# Patient Record
Sex: Male | Born: 1995 | Hispanic: Yes | Marital: Married | Smoking: Current every day smoker
Health system: Southern US, Community
[De-identification: ages and names within clinical notes are randomized; demographics above are authoritative.]

---

## 2016-10-09 ENCOUNTER — Encounter (HOSPITAL_COMMUNITY): Payer: Self-pay | Admitting: Emergency Medicine

## 2016-10-09 ENCOUNTER — Observation Stay (HOSPITAL_COMMUNITY)
Admission: EM | Admit: 2016-10-09 | Discharge: 2016-10-10 | Disposition: A | Discharge status: Home / Self Care | Payer: Self-pay | Attending: Surgery | Admitting: Surgery

## 2016-10-09 ENCOUNTER — Emergency Department (HOSPITAL_COMMUNITY): Payer: Self-pay | Admitting: Certified Registered"

## 2016-10-09 ENCOUNTER — Encounter (HOSPITAL_COMMUNITY)
Admission: EM | Disposition: A | Discharge status: Home / Self Care | Payer: Self-pay | Source: Home / Self Care | Attending: Emergency Medicine

## 2016-10-09 ENCOUNTER — Emergency Department (HOSPITAL_COMMUNITY): Payer: Self-pay

## 2016-10-09 DIAGNOSIS — Z720 Tobacco use: Secondary | ICD-10-CM

## 2016-10-09 DIAGNOSIS — K3580 Unspecified acute appendicitis: Principal | ICD-10-CM | POA: Insufficient documentation

## 2016-10-09 DIAGNOSIS — F17210 Nicotine dependence, cigarettes, uncomplicated: Secondary | ICD-10-CM | POA: Insufficient documentation

## 2016-10-09 DIAGNOSIS — K353 Acute appendicitis with localized peritonitis, without perforation or gangrene: Secondary | ICD-10-CM

## 2016-10-09 HISTORY — PX: LAPAROSCOPIC APPENDECTOMY: SHX408

## 2016-10-09 LAB — LIPASE, BLOOD: Lipase: 24 U/L (ref 11–51)

## 2016-10-09 LAB — COMPREHENSIVE METABOLIC PANEL
ALBUMIN: 5.2 g/dL — AB (ref 3.5–5.0)
ALT: 45 U/L (ref 17–63)
ANION GAP: 9 (ref 5–15)
AST: 27 U/L (ref 15–41)
Alkaline Phosphatase: 55 U/L (ref 38–126)
BILIRUBIN TOTAL: 0.5 mg/dL (ref 0.3–1.2)
BUN: 20 mg/dL (ref 6–20)
CO2: 28 mmol/L (ref 22–32)
Calcium: 10 mg/dL (ref 8.9–10.3)
Chloride: 101 mmol/L (ref 101–111)
Creatinine, Ser: 1 mg/dL (ref 0.61–1.24)
GFR calc Af Amer: >60 mL/min (ref >60)
GFR calc non Af Amer: >60 mL/min (ref >60)
GLUCOSE: 132 mg/dL — AB (ref 65–99)
POTASSIUM: 4.2 mmol/L (ref 3.5–5.1)
SODIUM: 138 mmol/L (ref 135–145)
TOTAL PROTEIN: 8.8 g/dL — AB (ref 6.5–8.1)

## 2016-10-09 LAB — URINALYSIS, ROUTINE W REFLEX MICROSCOPIC
BILIRUBIN URINE: NEGATIVE
Glucose, UA: NEGATIVE mg/dL
Hgb urine dipstick: NEGATIVE
KETONES UR: NEGATIVE mg/dL
LEUKOCYTES UA: NEGATIVE
NITRITE: NEGATIVE
PROTEIN: NEGATIVE mg/dL
Specific Gravity, Urine: 1.021 (ref 1.005–1.030)
pH: 7 (ref 5.0–8.0)

## 2016-10-09 LAB — CBC
HEMATOCRIT: 42.4 % (ref 39.0–52.0)
HEMOGLOBIN: 15 g/dL (ref 13.0–17.0)
MCH: 30.4 pg (ref 26.0–34.0)
MCHC: 35.4 g/dL (ref 30.0–36.0)
MCV: 85.8 fL (ref 78.0–100.0)
Platelets: 240 10*3/uL (ref 150–400)
RBC: 4.94 MIL/uL (ref 4.22–5.81)
RDW: 12.6 % (ref 11.5–15.5)
WBC: 17.2 10*3/uL — ABNORMAL HIGH (ref 4.0–10.5)

## 2016-10-09 SURGERY — APPENDECTOMY, LAPAROSCOPIC
Anesthesia: General | Site: Abdomen

## 2016-10-09 MED ORDER — DIPHENHYDRAMINE HCL 50 MG/ML IJ SOLN: 12.5000 mg | Freq: Four times a day (QID) | INTRAMUSCULAR | Status: DC | PRN

## 2016-10-09 MED ORDER — ONDANSETRON HCL 4 MG/2ML IJ SOLN
INTRAMUSCULAR | Status: AC
  Filled 2016-10-09: qty 4

## 2016-10-09 MED ORDER — ONDANSETRON HCL 4 MG/2ML IJ SOLN
4.0000 mg | Freq: Once | INTRAMUSCULAR | Status: AC
  Administered 2016-10-09: 4 mg via INTRAVENOUS
  Filled 2016-10-09: qty 2

## 2016-10-09 MED ORDER — ONDANSETRON 4 MG PO TBDP
4.0000 mg | ORAL_TABLET | Freq: Once | ORAL | Status: AC | PRN
  Administered 2016-10-09: 4 mg via ORAL
  Filled 2016-10-09: qty 1

## 2016-10-09 MED ORDER — OXYCODONE HCL 5 MG/5ML PO SOLN
5.0000 mg | Freq: Once | ORAL | Status: DC | PRN
  Filled 2016-10-09: qty 5

## 2016-10-09 MED ORDER — DEXTROSE 5 % IV SOLN
2.0000 g | Freq: Once | INTRAVENOUS | Status: AC
  Administered 2016-10-09: 2 g via INTRAVENOUS
  Filled 2016-10-09: qty 2

## 2016-10-09 MED ORDER — ROCURONIUM BROMIDE 10 MG/ML (PF) SYRINGE
PREFILLED_SYRINGE | INTRAVENOUS | Status: DC | PRN
  Administered 2016-10-09: 25 mg via INTRAVENOUS
  Administered 2016-10-09: 10 mg via INTRAVENOUS

## 2016-10-09 MED ORDER — ACETAMINOPHEN 325 MG PO TABS
325.0000 mg | ORAL_TABLET | Freq: Four times a day (QID) | ORAL | Status: DC | PRN
  Administered 2016-10-09 – 2016-10-10 (×2): 650 mg via ORAL
  Filled 2016-10-09 (×2): qty 2

## 2016-10-09 MED ORDER — DEXTROSE 5 % IV SOLN
2.0000 g | INTRAVENOUS | Status: DC
  Filled 2016-10-09: qty 2

## 2016-10-09 MED ORDER — BUPIVACAINE-EPINEPHRINE 0.25% -1:200000 IJ SOLN
INTRAMUSCULAR | Status: DC | PRN
  Administered 2016-10-09: 30 mL

## 2016-10-09 MED ORDER — MORPHINE SULFATE (PF) 4 MG/ML IV SOLN
4.0000 mg | Freq: Once | INTRAVENOUS | Status: AC
  Administered 2016-10-09: 4 mg via INTRAVENOUS
  Filled 2016-10-09: qty 1

## 2016-10-09 MED ORDER — OXYCODONE HCL 5 MG PO TABS: 5.0000 mg | ORAL_TABLET | Freq: Once | ORAL | Status: DC | PRN

## 2016-10-09 MED ORDER — 0.9 % SODIUM CHLORIDE (POUR BTL) OPTIME
TOPICAL | Status: DC | PRN
  Administered 2016-10-09: 1000 mL

## 2016-10-09 MED ORDER — BISACODYL 10 MG RE SUPP: 10.0000 mg | Freq: Two times a day (BID) | RECTAL | Status: DC | PRN

## 2016-10-09 MED ORDER — MIDAZOLAM HCL 2 MG/2ML IJ SOLN
INTRAMUSCULAR | Status: AC
  Filled 2016-10-09: qty 2

## 2016-10-09 MED ORDER — FENTANYL CITRATE (PF) 250 MCG/5ML IJ SOLN
INTRAMUSCULAR | Status: AC
  Filled 2016-10-09: qty 5

## 2016-10-09 MED ORDER — LACTATED RINGERS IR SOLN
Status: DC | PRN
  Administered 2016-10-09: 1000 mL

## 2016-10-09 MED ORDER — TRAMADOL HCL 50 MG PO TABS: 50.0000 mg | ORAL_TABLET | Freq: Four times a day (QID) | ORAL | 0 refills | Status: AC | PRN

## 2016-10-09 MED ORDER — DEXTROSE 5 % IV SOLN: 2.0000 g | INTRAVENOUS | Status: DC

## 2016-10-09 MED ORDER — SIMETHICONE 80 MG PO CHEW
40.0000 mg | CHEWABLE_TABLET | Freq: Four times a day (QID) | ORAL | Status: DC | PRN
Start: 2016-10-09 — End: 2016-10-10

## 2016-10-09 MED ORDER — BUPIVACAINE-EPINEPHRINE (PF) 0.25% -1:200000 IJ SOLN
INTRAMUSCULAR | Status: AC
  Filled 2016-10-09: qty 30

## 2016-10-09 MED ORDER — LIDOCAINE 2% (20 MG/ML) 5 ML SYRINGE
INTRAMUSCULAR | Status: AC
  Filled 2016-10-09: qty 10

## 2016-10-09 MED ORDER — ACETAMINOPHEN 650 MG RE SUPP: 650.0000 mg | Freq: Four times a day (QID) | RECTAL | Status: DC | PRN

## 2016-10-09 MED ORDER — MENTHOL 3 MG MT LOZG
1.0000 | LOZENGE | OROMUCOSAL | Status: DC | PRN
  Filled 2016-10-09: qty 9

## 2016-10-09 MED ORDER — LIP MEDEX EX OINT
1.0000 "application " | TOPICAL_OINTMENT | Freq: Two times a day (BID) | CUTANEOUS | Status: DC
  Administered 2016-10-09 – 2016-10-10 (×2): 1 via TOPICAL
  Filled 2016-10-09 (×3): qty 7

## 2016-10-09 MED ORDER — TRAMADOL HCL 50 MG PO TABS
50.0000 mg | ORAL_TABLET | Freq: Four times a day (QID) | ORAL | Status: DC | PRN
  Administered 2016-10-10 (×2): 50 mg via ORAL
  Filled 2016-10-09 (×2): qty 1

## 2016-10-09 MED ORDER — MAGIC MOUTHWASH
15.0000 mL | Freq: Four times a day (QID) | ORAL | Status: DC | PRN
  Filled 2016-10-09: qty 15

## 2016-10-09 MED ORDER — SUGAMMADEX SODIUM 200 MG/2ML IV SOLN
INTRAVENOUS | Status: AC
  Filled 2016-10-09: qty 6

## 2016-10-09 MED ORDER — MEPERIDINE HCL 50 MG/ML IJ SOLN: 6.2500 mg | INTRAMUSCULAR | Status: DC | PRN

## 2016-10-09 MED ORDER — METOCLOPRAMIDE HCL 5 MG/ML IJ SOLN: 5.0000 mg | Freq: Four times a day (QID) | INTRAMUSCULAR | Status: DC | PRN

## 2016-10-09 MED ORDER — DIPHENHYDRAMINE HCL 12.5 MG/5ML PO ELIX: 12.5000 mg | ORAL_SOLUTION | Freq: Four times a day (QID) | ORAL | Status: DC | PRN

## 2016-10-09 MED ORDER — HYDROCORTISONE 1 % EX CREA
1.0000 "application " | TOPICAL_CREAM | Freq: Three times a day (TID) | CUTANEOUS | Status: DC | PRN
  Filled 2016-10-09: qty 28

## 2016-10-09 MED ORDER — HYDROCORTISONE 2.5 % RE CREA
1.0000 "application " | TOPICAL_CREAM | Freq: Four times a day (QID) | RECTAL | Status: DC | PRN
  Filled 2016-10-09: qty 28.35

## 2016-10-09 MED ORDER — LACTATED RINGERS IV SOLN
INTRAVENOUS | Status: DC
  Administered 2016-10-09: 19:00:00 via INTRAVENOUS

## 2016-10-09 MED ORDER — ONDANSETRON 4 MG PO TBDP: 4.0000 mg | ORAL_TABLET | Freq: Four times a day (QID) | ORAL | Status: DC | PRN

## 2016-10-09 MED ORDER — METHOCARBAMOL 1000 MG/10ML IJ SOLN
1000.0000 mg | Freq: Four times a day (QID) | INTRAMUSCULAR | Status: DC | PRN
  Filled 2016-10-09: qty 10

## 2016-10-09 MED ORDER — SODIUM CHLORIDE 0.9 % IV SOLN
8.0000 mg | Freq: Four times a day (QID) | INTRAVENOUS | Status: DC | PRN
  Filled 2016-10-09: qty 4

## 2016-10-09 MED ORDER — DEXAMETHASONE SODIUM PHOSPHATE 10 MG/ML IJ SOLN
INTRAMUSCULAR | Status: AC
  Filled 2016-10-09: qty 2

## 2016-10-09 MED ORDER — SUGAMMADEX SODIUM 200 MG/2ML IV SOLN
INTRAVENOUS | Status: DC | PRN
  Administered 2016-10-09: 200 mg via INTRAVENOUS

## 2016-10-09 MED ORDER — HYDROMORPHONE HCL-NACL 0.5-0.9 MG/ML-% IV SOSY: 0.2500 mg | PREFILLED_SYRINGE | INTRAVENOUS | Status: DC | PRN

## 2016-10-09 MED ORDER — IOPAMIDOL (ISOVUE-300) INJECTION 61%
INTRAVENOUS | Status: AC
  Administered 2016-10-09: 100 mL via INTRAVENOUS
  Filled 2016-10-09: qty 100

## 2016-10-09 MED ORDER — ONDANSETRON HCL 4 MG/2ML IJ SOLN
INTRAMUSCULAR | Status: DC | PRN
  Administered 2016-10-09: 4 mg via INTRAVENOUS

## 2016-10-09 MED ORDER — PHENYLEPHRINE 40 MCG/ML (10ML) SYRINGE FOR IV PUSH (FOR BLOOD PRESSURE SUPPORT)
PREFILLED_SYRINGE | INTRAVENOUS | Status: DC | PRN
  Administered 2016-10-09 (×2): 120 ug via INTRAVENOUS

## 2016-10-09 MED ORDER — FENTANYL CITRATE (PF) 250 MCG/5ML IJ SOLN
INTRAMUSCULAR | Status: DC | PRN
  Administered 2016-10-09 (×3): 50 ug via INTRAVENOUS

## 2016-10-09 MED ORDER — ROCURONIUM BROMIDE 50 MG/5ML IV SOSY
PREFILLED_SYRINGE | INTRAVENOUS | Status: AC
  Filled 2016-10-09: qty 10

## 2016-10-09 MED ORDER — ENOXAPARIN SODIUM 40 MG/0.4ML ~~LOC~~ SOLN
40.0000 mg | SUBCUTANEOUS | Status: DC
  Administered 2016-10-10: 40 mg via SUBCUTANEOUS
  Filled 2016-10-09: qty 0.4

## 2016-10-09 MED ORDER — CHLORHEXIDINE GLUCONATE CLOTH 2 % EX PADS
6.0000 | MEDICATED_PAD | Freq: Once | CUTANEOUS | Status: AC
  Administered 2016-10-09: 6 via TOPICAL

## 2016-10-09 MED ORDER — GUAIFENESIN-DM 100-10 MG/5ML PO SYRP: 10.0000 mL | ORAL_SOLUTION | ORAL | Status: DC | PRN

## 2016-10-09 MED ORDER — LIDOCAINE 2% (20 MG/ML) 5 ML SYRINGE
INTRAMUSCULAR | Status: DC | PRN
  Administered 2016-10-09: 60 mg via INTRAVENOUS

## 2016-10-09 MED ORDER — PHENOL 1.4 % MT LIQD
1.0000 | OROMUCOSAL | Status: DC | PRN
  Filled 2016-10-09: qty 177

## 2016-10-09 MED ORDER — METHOCARBAMOL 500 MG PO TABS
1000.0000 mg | ORAL_TABLET | Freq: Four times a day (QID) | ORAL | Status: DC | PRN
  Administered 2016-10-09 – 2016-10-10 (×2): 1000 mg via ORAL
  Filled 2016-10-09 (×2): qty 2

## 2016-10-09 MED ORDER — PHENYLEPHRINE 40 MCG/ML (10ML) SYRINGE FOR IV PUSH (FOR BLOOD PRESSURE SUPPORT)
PREFILLED_SYRINGE | INTRAVENOUS | Status: AC
  Filled 2016-10-09: qty 20

## 2016-10-09 MED ORDER — ALUM & MAG HYDROXIDE-SIMETH 200-200-20 MG/5ML PO SUSP: 30.0000 mL | Freq: Four times a day (QID) | ORAL | Status: DC | PRN

## 2016-10-09 MED ORDER — NAPROXEN 500 MG PO TABS: 500.0000 mg | ORAL_TABLET | Freq: Two times a day (BID) | ORAL | 1 refills | Status: AC | PRN

## 2016-10-09 MED ORDER — PROCHLORPERAZINE EDISYLATE 5 MG/ML IJ SOLN: 5.0000 mg | INTRAMUSCULAR | Status: DC | PRN

## 2016-10-09 MED ORDER — ONDANSETRON HCL 4 MG/2ML IJ SOLN: 4.0000 mg | Freq: Four times a day (QID) | INTRAMUSCULAR | Status: DC | PRN

## 2016-10-09 MED ORDER — LACTATED RINGERS IV SOLN
INTRAVENOUS | Status: DC | PRN
  Administered 2016-10-09: 16:00:00 via INTRAVENOUS

## 2016-10-09 MED ORDER — SUCCINYLCHOLINE CHLORIDE 200 MG/10ML IV SOSY
PREFILLED_SYRINGE | INTRAVENOUS | Status: DC | PRN
  Administered 2016-10-09: 100 mg via INTRAVENOUS

## 2016-10-09 MED ORDER — SODIUM CHLORIDE 0.9 % IV BOLUS (SEPSIS)
1000.0000 mL | Freq: Once | INTRAVENOUS | Status: AC
  Administered 2016-10-09: 1000 mL via INTRAVENOUS

## 2016-10-09 MED ORDER — METRONIDAZOLE IN NACL 5-0.79 MG/ML-% IV SOLN
500.0000 mg | Freq: Once | INTRAVENOUS | Status: AC
  Administered 2016-10-09: 500 mg via INTRAVENOUS
  Filled 2016-10-09: qty 100

## 2016-10-09 MED ORDER — METOPROLOL TARTRATE 5 MG/5ML IV SOLN: 5.0000 mg | Freq: Four times a day (QID) | INTRAVENOUS | Status: DC | PRN

## 2016-10-09 MED ORDER — FENTANYL CITRATE (PF) 100 MCG/2ML IJ SOLN
50.0000 ug | INTRAMUSCULAR | Status: DC | PRN
  Administered 2016-10-09: 50 ug via NASAL
  Filled 2016-10-09: qty 2

## 2016-10-09 MED ORDER — LACTATED RINGERS IV BOLUS (SEPSIS): 1000.0000 mL | Freq: Three times a day (TID) | INTRAVENOUS | Status: DC | PRN

## 2016-10-09 MED ORDER — POLYETHYLENE GLYCOL 3350 17 G PO PACK: 17.0000 g | PACK | Freq: Two times a day (BID) | ORAL | Status: DC | PRN

## 2016-10-09 MED ORDER — METRONIDAZOLE IN NACL 5-0.79 MG/ML-% IV SOLN
500.0000 mg | Freq: Three times a day (TID) | INTRAVENOUS | Status: DC
  Administered 2016-10-09 – 2016-10-10 (×2): 500 mg via INTRAVENOUS
  Filled 2016-10-09 (×3): qty 100

## 2016-10-09 MED ORDER — PROMETHAZINE HCL 25 MG/ML IJ SOLN: 6.2500 mg | INTRAMUSCULAR | Status: DC | PRN

## 2016-10-09 MED ORDER — HYDROMORPHONE HCL 1 MG/ML IJ SOLN
0.5000 mg | INTRAMUSCULAR | Status: DC | PRN
  Administered 2016-10-09: 1 mg via INTRAVENOUS
  Filled 2016-10-09: qty 1

## 2016-10-09 MED ORDER — NAPROXEN 500 MG PO TABS
500.0000 mg | ORAL_TABLET | Freq: Two times a day (BID) | ORAL | Status: DC
  Administered 2016-10-10: 500 mg via ORAL
  Filled 2016-10-09: qty 1

## 2016-10-09 MED ORDER — PROPOFOL 10 MG/ML IV BOLUS
INTRAVENOUS | Status: DC | PRN
  Administered 2016-10-09: 150 mg via INTRAVENOUS

## 2016-10-09 MED ORDER — SUCCINYLCHOLINE CHLORIDE 200 MG/10ML IV SOSY
PREFILLED_SYRINGE | INTRAVENOUS | Status: AC
  Filled 2016-10-09: qty 10

## 2016-10-09 MED ORDER — HYDROMORPHONE HCL 1 MG/ML IJ SOLN
1.0000 mg | Freq: Once | INTRAMUSCULAR | Status: AC
  Administered 2016-10-09: 1 mg via INTRAVENOUS
  Filled 2016-10-09: qty 1

## 2016-10-09 MED ORDER — PROPOFOL 10 MG/ML IV BOLUS
INTRAVENOUS | Status: AC
  Filled 2016-10-09: qty 20

## 2016-10-09 MED ORDER — MIDAZOLAM HCL 2 MG/2ML IJ SOLN
INTRAMUSCULAR | Status: DC | PRN
Start: 2016-10-09 — End: 2016-10-09
  Administered 2016-10-09: 1 mg via INTRAVENOUS

## 2016-10-09 MED ORDER — CEFOTETAN DISODIUM-DEXTROSE 2-2.08 GM-% IV SOLR
INTRAVENOUS | Status: AC
  Filled 2016-10-09: qty 50

## 2016-10-09 SURGICAL SUPPLY — 42 items
APPLIER CLIP 5 13 M/L LIGAMAX5 (MISCELLANEOUS)
APPLIER CLIP ROT 10 11.4 M/L (STAPLE)
CABLE HIGH FREQUENCY MONO STRZ (ELECTRODE) ×3 IMPLANT
CHLORAPREP W/TINT 26ML (MISCELLANEOUS) ×3 IMPLANT
CLIP APPLIE 5 13 M/L LIGAMAX5 (MISCELLANEOUS) IMPLANT
CLIP APPLIE ROT 10 11.4 M/L (STAPLE) IMPLANT
COVER SURGICAL LIGHT HANDLE (MISCELLANEOUS) ×3 IMPLANT
CUTTER FLEX LINEAR 45M (STAPLE) ×3 IMPLANT
DECANTER SPIKE VIAL GLASS SM (MISCELLANEOUS) ×3 IMPLANT
DEVICE TROCAR PUNCTURE CLOSURE (ENDOMECHANICALS) IMPLANT
DRAPE LAPAROSCOPIC ABDOMINAL (DRAPES) ×3 IMPLANT
DRAPE WARM FLUID 44X44 (DRAPE) ×3 IMPLANT
DRSG TEGADERM 2-3/8X2-3/4 SM (GAUZE/BANDAGES/DRESSINGS) ×3 IMPLANT
DRSG TEGADERM 4X4.75 (GAUZE/BANDAGES/DRESSINGS) ×3 IMPLANT
ELECT REM PT RETURN 15FT ADLT (MISCELLANEOUS) ×3 IMPLANT
ENDOLOOP SUT PDS II  0 18 (SUTURE)
ENDOLOOP SUT PDS II 0 18 (SUTURE) IMPLANT
GAUZE SPONGE 2X2 8PLY STRL LF (GAUZE/BANDAGES/DRESSINGS) ×1 IMPLANT
GLOVE ECLIPSE 8.0 STRL XLNG CF (GLOVE) ×3 IMPLANT
GLOVE INDICATOR 8.0 STRL GRN (GLOVE) ×3 IMPLANT
GOWN STRL REUS W/TWL XL LVL3 (GOWN DISPOSABLE) ×6 IMPLANT
IRRIG SUCT STRYKERFLOW 2 WTIP (MISCELLANEOUS) ×3
IRRIGATION SUCT STRKRFLW 2 WTP (MISCELLANEOUS) ×1 IMPLANT
KIT BASIN OR (CUSTOM PROCEDURE TRAY) ×3 IMPLANT
PAD POSITIONING PINK XL (MISCELLANEOUS) ×3 IMPLANT
POUCH SPECIMEN RETRIEVAL 10MM (ENDOMECHANICALS) ×3 IMPLANT
RELOAD 45 VASCULAR/THIN (ENDOMECHANICALS) IMPLANT
RELOAD STAPLE TA45 3.5 REG BLU (ENDOMECHANICALS) ×3 IMPLANT
SCISSORS LAP 5X35 DISP (ENDOMECHANICALS) ×3 IMPLANT
SHEARS HARMONIC ACE PLUS 36CM (ENDOMECHANICALS) ×3 IMPLANT
SLEEVE XCEL OPT CAN 5 100 (ENDOMECHANICALS) ×3 IMPLANT
SPONGE GAUZE 2X2 STER 10/PKG (GAUZE/BANDAGES/DRESSINGS) ×2
SUT MNCRL AB 4-0 PS2 18 (SUTURE) ×3 IMPLANT
SUT PDS AB 0 CT1 36 (SUTURE) IMPLANT
SUT PDS AB 1 CT1 27 (SUTURE) IMPLANT
SUT SILK 2 0 SH (SUTURE) IMPLANT
TOWEL OR 17X26 10 PK STRL BLUE (TOWEL DISPOSABLE) ×3 IMPLANT
TRAY FOLEY W/METER SILVER 16FR (SET/KITS/TRAYS/PACK) ×3 IMPLANT
TRAY LAPAROSCOPIC (CUSTOM PROCEDURE TRAY) ×3 IMPLANT
TROCAR BLADELESS OPT 5 100 (ENDOMECHANICALS) ×3 IMPLANT
TROCAR XCEL 12X100 BLDLESS (ENDOMECHANICALS) ×3 IMPLANT
TUBING INSUF HEATED (TUBING) ×3 IMPLANT

## 2016-10-09 NOTE — Transfer of Care (Signed)
Immediate Anesthesia Transfer of Care Note  Patient: Wesley Nunez  Procedure(s) Performed: APPENDECTOMY LAPAROSCOPIC (N/A Abdomen)  Patient Location: PACU  Anesthesia Type:General  Level of Consciousness: sedated  Airway & Oxygen Therapy: Patient Spontanous Breathing and Patient connected to face mask oxygen  Post-op Assessment: Report given to RN and Post -op Vital signs reviewed and stable  Post vital signs: Reviewed and stable  Last Vitals:  Vitals:   10/09/16 1449 10/09/16 1531  BP: 136/70 (!) 156/85  Pulse: 76 96  Resp: 18 16  Temp:  37.3 C  SpO2: 100% 100%    Last Pain:  Vitals:   10/09/16 1531  TempSrc: Oral  PainSc: 9          Complications: No apparent anesthesia complications

## 2016-10-09 NOTE — Anesthesia Procedure Notes (Signed)
Date/Time: 10/09/2016 5:27 PM Performed by: Minerva Ends Pre-anesthesia Checklist: Suction available and Patient being monitored Oxygen Delivery Method: Simple face mask Placement Confirmation: positive ETCO2 and breath sounds checked- equal and bilateral Dental Injury: Teeth and Oropharynx as per pre-operative assessment  Comments: Extubated to simple face mask--- good AW--- O2 intact to PACU

## 2016-10-09 NOTE — ED Triage Notes (Signed)
Patient complaining of upper abdominal pain. Patient states he started hurting around 10 pm last night. Patient is also complaining of nausea and vomiting. Patient last bowel movement was 5 am today.

## 2016-10-09 NOTE — H&P (Addendum)
Wesley  [name] [place], [place] [address] Phone: [phone] FAX: [place]  [birth year] [phone]  CARE TEAM:  PCP: Patient, No Pcp Per  Outpatient Care Team: Patient Care Team: Patient, No Pcp Per as PCP - General (General Practice)  Inpatient Treatment Team: Treatment Team: Attending Provider: Blanchie Dessert, MD; Technician: Lowry Bowl, NT; Registered Nurse: Erick Colace, RN; Consulting Physician: Edison Pace, Md, MD   This patient is a 21 y.o.male who presents today for surgical evaluation at the request of Dr Blanchie Dessert, [facility]  Chief complaint / Reason for evaluation: Abdominal pain, probable appendicitis   Young smoking but active/healthy male.  Hispanic.  Second generation bilingual.  Developed diffuse crampy abdominal pain last night.  Became more intense.  Had nausea and vomiting.  Pain is now localized more to the right flank.  Uncomfortable to move.  Barely tolerating a few sips this morning.  Brought in by coworker.  History, physical, and CT scan concerning for appendicitis.  Surgical consultation requested.  He is not any prior surgeries.  He normally moves his bowels every day. Normally eats hot spicy foods without difficulty.  Denies heartburn or reflux.  Pain has progressively worsened.  No personal nor family history of GI/colon cancer, inflammatory bowel disease, irritable bowel syndrome, allergy such as Celiac Sprue, dietary/dairy problems, colitis, ulcers nor gastritis.  No recent sick contacts/gastroenteritis.  No travel outside the country.  No changes in diet.  No dysphagia to solids or liquids.  No significant heartburn or reflux.  No hematochezia, hematemesis, coffee ground emesis.  No evidence of prior gastric/peptic ulceration.    Assessment  Wesley Nunez  21 y.o. male     Procedure(s): APPENDECTOMY LAPAROSCOPIC  Problem List:  Principal Problem:   Acute  appendicitis Active Problems:   Tobacco abuse   History physical and CT scan findings are suspicious for acute appendicitis.  Fecalith.  Retro-colic positioning with tip near hepatic flexure explains pain being more lateral and posterior.  Plan:  Admission  IV fluids.  IV antibiotics  I do not think he is a good candidate for nonoperative management with only IV antibiotics given his significant pain with fecalith and inflammation.  Therefore, diagnostic laparoscopy with appendectomy:  The anatomy & physiology of the digestive tract was discussed.  The pathophysiology of appendicitis and other appendiceal disorders were discussed.  Natural history risks without surgery was discussed.   I feel the risks of no intervention will lead to serious problems that outweigh the operative risks; therefore, I recommended diagnostic laparoscopy with removal of appendix to remove the pathology.  Laparoscopic & open techniques were discussed.   I noted a good likelihood this will help address the problem.   Risks such as bleeding, infection, abscess, leak, reoperation, injury to other organs, need for repair of tissues / organs, possible ostomy, hernia, heart attack, stroke, death, and other risks were discussed.  Goals of post-operative recovery were discussed as well.  We will work to minimize complications.  Questions were answered.  The patient expresses understanding & wishes to proceed with surgery.  STOP SMOKING! We talked to the patient about the dangers of smoking.  We stressed that tobacco use dramatically increases the risk of peri-operative complications such as infection, tissue necrosis leaving to problems with incision/wound and organ healing, hernia, chronic pain, heart attack, stroke, DVT, pulmonary embolism, and death.  We noted there are programs in our community to  help stop smoking.  Information was available.   -VTE prophylaxis- SCDs, etc -mobilize as tolerated to help recovery  30  minutes spent in review, evaluation, examination, counseling, and coordination of care.  More than 50% of that time was spent in counseling.  Adin Hector, M.D., F.A.C.S. Gastrointestinal and Minimally Invasive Surgery [facility] [address], [place], [place] [address] [phone] Main / Paging   10/09/2016      History reviewed. No pertinent past medical history.  No past surgical history on file.  Social History   Social History  . Marital status: Married    Spouse name: N/A  . Number of children: N/A  . Years of education: N/A   Occupational History  . Not on file.   Social History Main Topics  . Smoking status: Not on file  . Smokeless tobacco: Not on file  . Alcohol use Not on file  . Drug use: Unknown  . Sexual activity: Not on file   Other Topics Concern  . Not on file   Social History Narrative  . No narrative on file    History reviewed. No pertinent family history.  Current Facility-Administered Medications  Medication Dose Route Frequency Provider Last Rate Last Dose  . acetaminophen (TYLENOL) suppository 650 mg  650 mg Rectal Q6H PRN Michael Boston, MD      . alum & mag hydroxide-simeth (MAALOX/MYLANTA) 200-200-20 MG/5ML suspension 30 mL  30 mL Oral Q6H PRN Michael Boston, MD      . bisacodyl (DULCOLAX) suppository 10 mg  10 mg Rectal Q12H PRN Michael Boston, MD      . Derrill Memo ON 10/10/2016] cefoTEtan (CEFOTAN) 2 g in dextrose 5 % 50 mL IVPB  2 g Intravenous On Call to OR Michael Boston, MD      . Chlorhexidine Gluconate Cloth 2 % PADS 6 each  6 each Topical Once Michael Boston, MD       And  . Chlorhexidine Gluconate Cloth 2 % PADS 6 each  6 each Topical Once Michael Boston, MD      . diphenhydrAMINE (BENADRYL) injection 12.5-25 mg  12.5-25 mg Intravenous Q6H PRN Michael Boston, MD      . guaiFENesin-dextromethorphan (ROBITUSSIN DM) 100-10 MG/5ML syrup 10 mL  10 mL Oral Q4H PRN Michael Boston, MD      .  hydrocortisone (ANUSOL-HC) 2.5 % rectal cream 1 application  1 application Topical QID PRN Michael Boston, MD      . hydrocortisone cream 1 % 1 application  1 application Topical TID PRN Michael Boston, MD      . HYDROmorphone (DILAUDID) injection 0.5-2 mg  0.5-2 mg Intravenous Q1H PRN Michael Boston, MD      . lactated ringers bolus 1,000 mL  1,000 mL Intravenous Q8H PRN Michael Boston, MD      . lip balm (CARMEX) ointment 1 application  1 application Topical BID Michael Boston, MD      . magic mouthwash  15 mL Oral QID PRN Michael Boston, MD      . menthol-cetylpyridinium (CEPACOL) lozenge 3 mg  1 lozenge Oral PRN Michael Boston, MD      . methocarbamol (ROBAXIN) 1,000 mg in dextrose 5 % 50 mL IVPB  1,000 mg Intravenous Q6H PRN Michael Boston, MD      . metoCLOPramide (REGLAN) injection 5-10 mg  5-10 mg Intravenous Q6H PRN Michael Boston, MD      . metoprolol tartrate (LOPRESSOR) injection 5 mg  5  mg Intravenous Q6H PRN Michael Boston, MD      . metroNIDAZOLE (FLAGYL) IVPB 500 mg  500 mg Intravenous Once Blanchie Dessert, MD      . ondansetron (ZOFRAN) injection 4 mg  4 mg Intravenous Q6H PRN Michael Boston, MD       Or  . ondansetron (ZOFRAN) 8 mg in sodium chloride 0.9 % 50 mL IVPB  8 mg Intravenous Q6H PRN Michael Boston, MD      . phenol (CHLORASEPTIC) mouth spray 1-2 spray  1-2 spray Mouth/Throat PRN Michael Boston, MD      . prochlorperazine (COMPAZINE) injection 5-10 mg  5-10 mg Intravenous Q4H PRN Michael Boston, MD       No current outpatient prescriptions on file.     No Known Allergies  ROS:   All other systems reviewed & are negative except per HPI or as noted below: Constitutional:  No fevers, chills, sweats.  Weight stable Eyes:  No vision changes, No discharge HENT:  No sore throats, nasal drainage Lymph: No neck swelling, No bruising easily Pulmonary:  No cough, productive sputum CV: No orthopnea, PND  Patient walks 60 minutes for about 2 miles without difficulty.  No exertional  chest/neck/shoulder/arm pain. GI: No personal nor family history of GI/colon cancer, inflammatory bowel disease, irritable bowel syndrome, allergy such as Celiac Sprue, dietary/dairy problems, colitis, ulcers nor gastritis.  No recent sick contacts/gastroenteritis.  No travel outside the country.  No changes in diet. Renal: No UTIs, No hematuria Genital:  No drainage, bleeding, masses Musculoskeletal: No severe joint pain.  Good ROM major joints Skin:  No sores or lesions.  No rashes Heme/Lymph:  No easy bleeding.  No swollen lymph nodes Neuro: No focal weakness/numbness.  No seizures Psych: No suicidal ideation.  No hallucinations  BP (!) 156/85   Pulse 96   Temp 99.1 F (37.3 C) (Oral)   Resp 16   Ht 5' 6"  (1.676 m)   Wt 86.2 kg (190 lb)   SpO2 100%   BMI 30.67 kg/m   Physical Exam: General: Pt awake/alert/oriented x4 in moderate acute distress Eyes: PERRL, normal EOM. Sclera nonicteric Neuro: CN II-XII intact w/o focal sensory/motor deficits. Lymph: No head/neck/groin lymphadenopathy Psych:  No delerium/psychosis/paranoia HENT: Normocephalic, Mucus membranes moist.  No thrush Neck: Supple, No tracheal deviation Chest: No pain.  Good respiratory excursion. CV:  Pulses intact.  Regular rhythm Abdomen: Somewhat firm.  Mildly distended.  Tenderness to palpation in right lower quadrant lateral to McBurney's point more towards the lateral and posterior right flank.  Some guarding associated with this.  Positive Rosvig sign.  Some peritonitis with bed tap & cough - not severe but just received IV narcotics.  The rest of the quadrants are nontender.  No diastases.  No umbilical hernia. Gen:  No inguinal hernias.  No inguinal lymphadenopathy.   Ext:  SCDs BLE.  No significant edema.  No cyanosis Skin: No petechiae / purpurea.  No major sores Musculoskeletal: No severe joint pain.  Good ROM major joints   Results:   Labs: Results for orders placed or performed during the hospital  encounter of 10/09/16 (from the past 48 hour(s))  Lipase, blood     Status: None   Collection Time: 10/09/16 10:56 AM  Result Value Ref Range   Lipase 24 11 - 51 U/L  Comprehensive metabolic panel     Status: Abnormal   Collection Time: 10/09/16 10:56 AM  Result Value Ref Range   Sodium 138 135 - 145  mmol/L   Potassium 4.2 3.5 - 5.1 mmol/L   Chloride 101 101 - 111 mmol/L   CO2 28 22 - 32 mmol/L   Glucose, Bld 132 (H) 65 - 99 mg/dL   BUN 20 6 - 20 mg/dL   Creatinine, Ser 1.00 0.61 - 1.24 mg/dL   Calcium 10.0 8.9 - 10.3 mg/dL   Total Protein 8.8 (H) 6.5 - 8.1 g/dL   Albumin 5.2 (H) 3.5 - 5.0 g/dL   AST 27 15 - 41 U/L   ALT 45 17 - 63 U/L   Alkaline Phosphatase 55 38 - 126 U/L   Total Bilirubin 0.5 0.3 - 1.2 mg/dL   GFR calc non Af Amer >60 >60 mL/min   GFR calc Af Amer >60 >60 mL/min    Comment: (NOTE) The eGFR has been calculated using the CKD EPI equation. This calculation has not been validated in all clinical situations. eGFR's persistently <60 mL/min signify possible Chronic Kidney Disease.    Anion gap 9 5 - 15  CBC     Status: Abnormal   Collection Time: 10/09/16 10:56 AM  Result Value Ref Range   WBC 17.2 (H) 4.0 - 10.5 K/uL   RBC 4.94 4.22 - 5.81 MIL/uL   Hemoglobin 15.0 13.0 - 17.0 g/dL   HCT 42.4 39.0 - 52.0 %   MCV 85.8 78.0 - 100.0 fL   MCH 30.4 26.0 - 34.0 pg   MCHC 35.4 30.0 - 36.0 g/dL   RDW 12.6 11.5 - 15.5 %   Platelets 240 150 - 400 K/uL  Urinalysis, Routine w reflex microscopic     Status: Abnormal   Collection Time: 10/09/16 11:01 AM  Result Value Ref Range   Color, Urine YELLOW YELLOW   APPearance CLOUDY (A) CLEAR   Specific Gravity, Urine 1.021 1.005 - 1.030   pH 7.0 5.0 - 8.0   Glucose, UA NEGATIVE NEGATIVE mg/dL   Hgb urine dipstick NEGATIVE NEGATIVE   Bilirubin Urine NEGATIVE NEGATIVE   Ketones, ur NEGATIVE NEGATIVE mg/dL   Protein, ur NEGATIVE NEGATIVE mg/dL   Nitrite NEGATIVE NEGATIVE   Leukocytes, UA NEGATIVE NEGATIVE    Imaging /  Studies: Ct Abdomen Pelvis W Contrast  Result Date: 10/09/2016 CLINICAL DATA:  Upper abdominal pain beginning last evening. Nausea and vomiting. EXAM: CT ABDOMEN AND PELVIS WITH CONTRAST TECHNIQUE: Multidetector CT imaging of the abdomen and pelvis was performed using the standard protocol following bolus administration of intravenous contrast. CONTRAST:  <See Chart> ISOVUE-300 IOPAMIDOL (ISOVUE-300) INJECTION 61% COMPARISON:  None. FINDINGS: Lower chest: Limited visualization of the lower thorax demonstrates minimal dependent subpleural ground-glass atelectasis. No discrete focal airspace opacities. No pleural effusion. Normal heart size. No pericardial effusion. Hepatobiliary: Normal hepatic contour. There is a minimal amount of focal fatty infiltration adjacent to the fissure for ligamentum teres. No discrete hepatic lesions. Normal appearance of the gallbladder given degree distention. No radiopaque gallstones. No intra extrahepatic bili duct dilatation. No ascites. Pancreas: Normal appearance of the pancreas Spleen: Normal appearance of the spleen Adrenals/Urinary Tract: There is symmetric enhancement of the bilateral kidneys. No definite renal stones on this postcontrast examination. No discrete renal lesions. No urine obstruction or perinephric stranding. Normal appearance the bilateral adrenal glands. Normal appearance of the urinary bladder given degree distention. Stomach/Bowel: The appendix is dilated, measuring 1.2 cm in diameter (coronal image 82) with associated minimal amount of periappendiceal stranding (Representative coronal images 82 and 86, series 3, axial image 41, series 2). Feculent material is seen within the base  of the appendix. No definitive radiopaque appendicolith. The bowel is otherwise normal in course and caliber without wall thickening or evidence of enteric obstruction. Normal appearance of the terminal ileum. No pneumoperitoneum, pneumatosis or portal venous gas.  Vascular/Lymphatic: Normal caliber of the abdominal aorta. The major branch vessels of the abdominal aorta appear patent on this non CTA examination. No bulky retroperitoneal, mesenteric, pelvic or inguinal lymphadenopathy. Reproductive: Normal appearance of the prostate gland. No free fluid in the pelvic cul-de-sac. Other: Regional soft tissues appear normal. Musculoskeletal: No acute or aggressive osseous abnormalities. IMPRESSION: Findings worrisome for acute uncomplicated appendicitis. No evidence of perforation or definable / drainable fluid collection. Electronically Signed   By: Sandi Mariscal M.D.   On: 10/09/2016 14:34    Medications / Allergies: per chart  Antibiotics: Anti-infectives    Start     Dose/Rate Route Frequency Ordered Stop   10/10/16 0600  cefoTEtan (CEFOTAN) 2 g in dextrose 5 % 50 mL IVPB     2 g 100 mL/hr over 30 Minutes Intravenous On call to O.R. 10/09/16 1534 10/11/16 0559   10/09/16 1445  cefTRIAXone (ROCEPHIN) 2 g in dextrose 5 % 50 mL IVPB     2 g 100 mL/hr over 30 Minutes Intravenous  Once 10/09/16 1444 10/09/16 1531   10/09/16 1445  metroNIDAZOLE (FLAGYL) IVPB 500 mg     500 mg 100 mL/hr over 60 Minutes Intravenous  Once 10/09/16 1444          Note: Portions of this report may have been transcribed using voice recognition software. Every effort was made to ensure accuracy; however, inadvertent computerized transcription errors may be present.   Any transcriptional errors that result from this process are unintentional.    Adin Hector, M.D., F.A.C.S. Gastrointestinal and Minimally Invasive Surgery [facility] [address] [place], [place] [address] [phone] Main / Paging   10/09/2016

## 2016-10-09 NOTE — Anesthesia Postprocedure Evaluation (Signed)
Anesthesia Post Note  Patient: Wesley Nunez  Procedure(s) Performed: APPENDECTOMY LAPAROSCOPIC (N/A Abdomen)     Patient location during evaluation: PACU Anesthesia Type: General Level of consciousness: awake and alert Pain management: pain level controlled Vital Signs Assessment: post-procedure vital signs reviewed and stable Respiratory status: spontaneous breathing, nonlabored ventilation and respiratory function stable Cardiovascular status: blood pressure returned to baseline and stable Postop Assessment: no apparent nausea or vomiting Anesthetic complications: no    Last Vitals:  Vitals:   10/09/16 1815 10/09/16 1821  BP: 129/74 117/64  Pulse: (!) 101 88  Resp: 15 12  Temp:  36.9 C  SpO2: 100% 100%    Last Pain:  Vitals:   10/09/16 1821  TempSrc:   PainSc: Asleep                 Lowella Curb

## 2016-10-09 NOTE — Anesthesia Procedure Notes (Signed)
Procedure Name: Intubation Date/Time: 10/09/2016 4:22 PM Performed by: Minerva Ends Pre-anesthesia Checklist: Patient identified, Emergency Drugs available, Suction available and Patient being monitored Patient Re-evaluated:Patient Re-evaluated prior to induction Oxygen Delivery Method: Circle System Utilized Preoxygenation: Pre-oxygenation with 100% oxygen Induction Type: IV induction Ventilation: Mask ventilation without difficulty Laryngoscope Size: Miller and 2 Grade View: Grade I Tube type: Oral Tube size: 7.5 mm Number of attempts: 1 Airway Equipment and Method: Stylet Placement Confirmation: ETT inserted through vocal cords under direct vision,  positive ETCO2 and breath sounds checked- equal and bilateral Secured at: 22 cm Tube secured with: Tape Dental Injury: Teeth and Oropharynx as per pre-operative assessment  Comments: Smooth IV induction Miller-- intubation MA CRNA atraumatic-- teeth and mouth as preop--- irregular surfaces on front teeth prior to laryngoscopy-- bilat BS [employer]

## 2016-10-09 NOTE — Discharge Instructions (Signed)
SURGERY: POST OP INSTRUCTIONS (Surgery for appendicitis, small bowel obstruction, colon resection, etc)   ######################################################################  EAT Gradually transition to a high fiber diet with a fiber supplement over the next few days after discharge  WALK Walk an hour a day.  Control your pain to do that.    CONTROL PAIN Control pain so that you can walk, sleep, tolerate sneezing/coughing, go up/down stairs.  HAVE A BOWEL MOVEMENT DAILY Keep your bowels regular to avoid problems.  OK to try a laxative to override constipation.  OK to use an antidairrheal to slow down diarrhea.  Call if not better after 2 tries  CALL IF YOU HAVE PROBLEMS/CONCERNS Call if you are still struggling despite following these instructions. Call if you have concerns not answered by these instructions  ######################################################################   DIET Follow a light diet the first few days at home.  Start with a bland diet such as soups, liquids, starchy foods, low fat foods, etc.  If you feel full, bloated, or constipated, stay on a ful liquid or pureed/blenderized diet for a few days until you feel better and no longer constipated. Be sure to drink plenty of fluids every day to avoid getting dehydrated (feeling dizzy, not urinating, etc.). Gradually add a fiber supplement to your diet over the next week.  Gradually get back to a regular solid diet.  Avoid fast food or heavy meals the first week as you are more likely to get nauseated. It is expected for your digestive tract to need a few months to get back to normal.  It is common for your bowel movements and stools to be irregular.  You will have occasional bloating and cramping that should eventually fade away.  Until you are eating solid food normally, off all pain medications, and back to regular activities; your bowels will not be normal. Focus on eating a low-fat, high fiber diet the rest of  your life (See Getting to Good Bowel Health, below).  CARE of your INCISION or WOUND It is good for closed incision and even open wounds to be washed every day.  Shower every day.  Short baths are fine.  Wash the incisions and wounds clean with soap & water.    If you have a closed incision(s), wash the incision with soap & water every day.  You may leave closed incisions open to air if it is dry.   You may cover the incision with clean gauze & replace it after your daily shower for comfort. If you have skin tapes (Steristrips) or skin glue (Dermabond) on your incision, leave them in place.  They will fall off on their own like a scab.  You may trim any edges that curl up with clean scissors.  If you have staples, set up an appointment for them to be removed in the office in 10 days after surgery.  If you have a drain, wash around the skin exit site with soap & water and place a new dressing of gauze or band aid around the skin every day.  Keep the drain site clean & dry.    If you have an open wound with packing, see wound care instructions.  In general, it is encouraged that you remove your dressing and packing, shower with soap & water, and replace your dressing once a day.  Pack the wound with clean gauze moistened with normal (0.9%) saline to keep the wound moist & uninfected.  Pressure on the dressing for 30 minutes will stop most  wound bleeding.  Eventually your body will heal & pull the open wound closed over the next few months.  Raw open wounds will occasionally bleed or secrete yellow drainage until it heals closed.  Drain sites will drain a little until the drain is removed.  Even closed incisions can have mild bleeding or drainage the first few days until the skin edges scab over & seal.   If you have an open wound with a wound vac, see wound vac care instructions.     ACTIVITIES as tolerated Start light daily activities --- self-care, walking, climbing stairs-- beginning the day after  surgery.  Gradually increase activities as tolerated.  Control your pain to be active.  Stop when you are tired.  Ideally, walk several times a day, eventually an hour a day.   Most people are back to most day-to-day activities in a few weeks.  It takes 4-8 weeks to get back to unrestricted, intense activity. If you can walk 30 minutes without difficulty, it is safe to try more intense activity such as jogging, treadmill, bicycling, low-impact aerobics, swimming, etc. Save the most intensive and strenuous activity for last (Usually 4-8 weeks after surgery) such as sit-ups, heavy lifting, contact sports, etc.  Refrain from any intense heavy lifting or straining until you are off narcotics for pain control.  You will have off days, but things should improve week-by-week. DO NOT PUSH THROUGH PAIN.  Let pain be your guide: If it hurts to do something, don't do it.  Pain is your body warning you to avoid that activity for another week until the pain goes down. You may drive when you are no longer taking narcotic prescription pain medication, you can comfortably wear a seatbelt, and you can safely make sudden turns/stops to protect yourself without hesitating due to pain. You may have sexual intercourse when it is comfortable. If it hurts to do something, stop.  MEDICATIONS Take your usually prescribed home medications unless otherwise directed.   Blood thinners:  Usually you can restart any strong blood thinners after the second postoperative day.  It is OK to take aspirin right away.     If you are on strong blood thinners (warfarin/Coumadin, Plavix, Xerelto, Eliquis, Pradaxa, etc), discuss with your surgeon, medicine PCP, and/or cardiologist for instructions on when to restart the blood thinner & if blood monitoring is needed (PT/INR blood check, etc).     PAIN CONTROL Pain after surgery or related to activity is often due to strain/injury to muscle, tendon, nerves and/or incisions.  This pain is  usually short-term and will improve in a few months.  To help speed the process of healing and to get back to regular activity more quickly, DO THE FOLLOWING THINGS TOGETHER: 1. Increase activity gradually.  DO NOT PUSH THROUGH PAIN 2. Use Ice and/or Heat 3. Try Gentle Massage and/or Stretching 4. Take over the counter pain medication 5. Take Narcotic prescription pain medication for more severe pain  Good pain control = faster recovery.  It is better to take more medicine to be more active than to stay in bed all day to avoid medications. 1.  Increase activity gradually Avoid heavy lifting at first, then increase to lifting as tolerated over the next 6 weeks. Do not push through the pain.  Listen to your body and avoid positions and maneuvers than reproduce the pain.  Wait a few days before trying something more intense Walking an hour a day is encouraged to help your body recover  faster and more safely.  Start slowly and stop when getting sore.  If you can walk 30 minutes without stopping or pain, you can try more intense activity (running, jogging, aerobics, cycling, swimming, treadmill, sex, sports, weightlifting, etc.) Remember: If it hurts to do it, then dont do it! 2. Use Ice and/or Heat You will have swelling and bruising around the incisions.  This will take several weeks to resolve. Ice packs or heating pads (6-8 times a day, 30-60 minutes at a time) will help sooth soreness & bruising. Some people prefer to use ice alone, heat alone, or alternate between ice & heat.  Experiment and see what works best for you.  Consider trying ice for the first few days to help decrease swelling and bruising; then, switch to heat to help relax sore spots and speed recovery. Shower every day.  Short baths are fine.  It feels good!  Keep the incisions and wounds clean with soap & water.   3. Try Gentle Massage and/or Stretching Massage at the area of pain many times a day Stop if you feel pain - do  not overdo it 4. Take over the counter pain medication This helps the muscle and nerve tissues become less irritable and calm down faster Choose ONE of the following over-the-counter anti-inflammatory medications: Acetaminophen 500mg  tabs (Tylenol) 1-2 pills with every meal and just before bedtime (avoid if you have liver problems or if you have acetaminophen in you narcotic prescription) Naproxen 220mg  tabs (ex. Aleve, Naprosyn) 1-2 pills twice a day (avoid if you have kidney, stomach, IBD, or bleeding problems) Ibuprofen 200mg  tabs (ex. Advil, Motrin) 3-4 pills with every meal and just before bedtime (avoid if you have kidney, stomach, IBD, or bleeding problems) Take with food/snack several times a day as directed for at least 2 weeks to help keep pain / soreness down & more manageable. 5. Take Narcotic prescription pain medication for more severe pain A prescription for strong pain control is often given to you upon discharge (for example: oxycodone/Percocet, hydrocodone/Norco/Vicodin, or tramadol/Ultram) Take your pain medication as prescribed. Be mindful that most narcotic prescriptions contain Tylenol (acetaminophen) as well - avoid taking too much Tylenol. If you are having problems/concerns with the prescription medicine (does not control pain, nausea, vomiting, rash, itching, etc.), please call us [phone] to see if we need to switch you to a different pain medicine that will work better for you and/or control your side effects better. If you need a refill on your pain medication, you must call the office before 4 pm and on weekdays only.  By federal law, prescriptions for narcotics cannot be called into a pharmacy.  They must be filled out on paper & picked up from our office by the patient or authorized caretaker.  Prescriptions cannot be filled after 4 pm nor on weekends.    WHEN TO CALL us [phone] Severe uncontrolled or worsening pain  Fever over 101 F (38.5 C) Concerns  with the incision: Worsening pain, redness, rash/hives, swelling, bleeding, or drainage Reactions / problems with new medications (itching, rash, hives, nausea, etc.) Nausea and/or vomiting Difficulty urinating Difficulty breathing Worsening fatigue, dizziness, lightheadedness, blurred vision Other concerns If you are not getting better after two weeks or are noticing you are getting worse, contact our office [phone] for further advice.  We may need to adjust your medications, re-evaluate you in the office, send you to the emergency room, or see what other things we can do to help.  The clinic staff is available to answer your questions during regular business hours (8:30am-5pm).  Please dont hesitate to call and ask to speak to one of our nurses for clinical concerns.    A surgeon from [facility] is always on call at the hospitals 24 hours/day If you have a medical emergency, go to the nearest emergency room or call 911.  FOLLOW UP in our office One the day of your discharge from the hospital (or the next business weekday), please call [facility] to set up or confirm an appointment to see your surgeon in the office for a follow-up appointment.  Usually it is 2-3 weeks after your surgery.   If you have skin staples at your incision(s), let the office know so we can set up a time in the office for the nurse to remove them (usually around 10 days after surgery). Make sure that you call for appointments the day of discharge (or the next business weekday) from the hospital to ensure a convenient appointment time. IF YOU HAVE DISABILITY OR FAMILY LEAVE FORMS, BRING THEM TO THE OFFICE FOR PROCESSING.  DO NOT GIVE THEM TO YOUR DOCTOR.  [facility] [place] [address], [place], [place], [place]  [address] ? [phone] - Main [phone] - [place],  [phone] - Fax [email or web address]  GETTING TO GOOD BOWEL HEALTH. It is  expected for your digestive tract to need a few months to get back to normal.  It is common for your bowel movements and stools to be irregular.  You will have occasional bloating and cramping that should eventually fade away.  Until you are eating solid food normally, off all pain medications, and back to regular activities; your bowels will not be normal.   Avoiding constipation The goal: ONE SOFT BOWEL MOVEMENT A DAY!    Drink plenty of fluids.  Choose water first. TAKE A FIBER SUPPLEMENT EVERY DAY THE REST OF YOUR LIFE During your first week back home, gradually add back a fiber supplement every day Experiment which form you can tolerate.   There are many forms such as powders, tablets, wafers, gummies, etc Psyllium bran (Metamucil), methylcellulose (Citrucel), Miralax or Glycolax, Benefiber, Flax Seed.  Adjust the dose week-by-week (1/2 dose/day to 6 doses a day) until you are moving your bowels 1-2 times a day.  Cut back the dose or try a different fiber product if it is giving you problems such as diarrhea or bloating. Sometimes a laxative is needed to help jump-start bowels if constipated until the fiber supplement can help regulate your bowels.  If you are tolerating eating & you are farting, it is okay to try a gentle laxative such as double dose MiraLax, prune juice, or Milk of Magnesia.  Avoid using laxatives too often. Stool softeners can sometimes help counteract the constipating effects of narcotic pain medicines.  It can also cause diarrhea, so avoid using for too long. If you are still constipated despite taking fiber daily, eating solids, and a few doses of laxatives, call our office. Controlling diarrhea Try drinking liquids and eating bland foods for a few days to avoid stressing your intestines further. Avoid dairy products (especially milk & ice cream) for a short time.  The intestines often can lose the ability to digest lactose when stressed. Avoid foods that cause gassiness or  bloating.  Typical foods include beans and other legumes, cabbage, broccoli, and dairy foods.  Avoid greasy, spicy, fast foods.  Every person  has some sensitivity to other foods, so listen to your body and avoid those foods that trigger problems for you. Probiotics (such as active yogurt, Align, etc) may help repopulate the intestines and colon with normal bacteria and calm down a sensitive digestive tract Adding a fiber supplement gradually can help thicken stools by absorbing excess fluid and retrain the intestines to act more normally.  Slowly increase the dose over a few weeks.  Too much fiber too soon can backfire and cause cramping & bloating. It is okay to try and slow down diarrhea with a few doses of antidiarrheal medicines.   Bismuth subsalicylate (ex. Kayopectate, Pepto Bismol) for a few doses can help control diarrhea.  Avoid if pregnant.   Loperamide (Imodium) can slow down diarrhea.  Start with one tablet ( ) first.  Avoid if you are having fevers or severe pain.  ILEOSTOMY PATIENTS WILL HAVE CHRONIC DIARRHEA since their colon is not in use.    Drink plenty of liquids.  You will need to drink even more glasses of water/liquid a day to avoid getting dehydrated. Record output from your ileostomy.  Expect to empty the bag every 3-4 hours at first.  Most people with a permanent ileostomy empty their bag 4-6 times at the least.   Use antidiarrheal medicine (especially Imodium) several times a day to avoid getting dehydrated.  Start with a dose at bedtime & breakfast.  Adjust up or down as needed.  Increase antidiarrheal medications as directed to avoid emptying the bag more than 8 times a day (every 3 hours). Work with your wound ostomy nurse to learn care for your ostomy.  See ostomy care instructions. TROUBLESHOOTING IRREGULAR BOWELS 1) Start with a soft & bland diet. No spicy, greasy, or fried foods.  2) Avoid gluten/wheat or dairy products from diet to see if symptoms improve. 3) Miralax  17gm or flax seed mixed in 8oz. water or juice-daily. May use 2-4 times a day as needed. 4) Gas-X, Phazyme, etc. as needed for gas & bloating.  5) Prilosec (omeprazole) over-the-counter as needed 6)  Consider probiotics (Align, Activa, etc) to help calm the bowels down  Call your doctor if you are getting worse or not getting better.  Sometimes further testing (cultures, endoscopy, X-ray studies, CT scans, bloodwork, etc.) may be needed to help diagnose and treat the cause of the diarrhea. [facility] [address], [place], [place]  [address] [phone] - Main.    [phone]  - Toll Free.   [phone] - Fax [email or web address]   Appendicitis The appendix is a finger-shaped tube that is attached to the large intestine. Appendicitis is inflammation of the appendix. Without treatment, appendicitis can cause the appendix to tear (rupture). A ruptured appendix can lead to a life-threatening infection. It can also lead to the formation of a painful collection of pus (abscess) in the appendix. What are the causes? This condition may be caused by a blockage in the appendix that leads to infection. The blockage can be due to:  A ball of stool.  Enlarged lymph glands.  In some cases, the cause may not be known. What increases the risk? This condition is more likely to develop in people who are 54-2 years of age. What are the signs or symptoms? Symptoms of this condition include:  Pain around the belly button that moves toward the lower right abdomen. The pain can become more severe as time passes. It gets worse with coughing or sudden movements.  Tenderness in the lower right abdomen. °· Nausea. °· Vomiting. °· Loss of appetite. °· Fever. °· Constipation. °· Diarrhea. °· Generally not feeling well. °How is this diagnosed? °This condition may be diagnosed with: °· A physical exam. °· Blood tests. °· Urine test. °To confirm the diagnosis, an  ultrasound, MRI, or CT scan may be done. °How is this treated? °This condition is usually treated by taking out the appendix (appendectomy). There are two methods for doing an appendectomy: °· Open appendectomy. In this surgery, the appendix is removed through a large cut (incision) that is made in the lower right abdomen. This procedure may be recommended if: °¨ You have major scarring from a previous surgery. °¨ You have a bleeding disorder. °¨ You are pregnant and are near term. °¨ You have a condition that makes the laparoscopic procedure impossible, such as an advanced infection or a ruptured appendix. °· Laparoscopic appendectomy. In this surgery, the appendix is removed through small incisions. This procedure usually causes less pain and fewer problems than an open appendectomy. It also has a shorter recovery time. °If the appendix has ruptured and an abscess has formed, a drain may be placed into the abscess to remove fluid and antibiotic medicines may be given through an IV tube. The appendix may or may not need to be removed. °This information is not intended to replace advice given to you by your health care provider. Make sure you discuss any questions you have with your health care provider. °Document Released: 12/27/2004 Document Revised: 05/06/2015 Document Reviewed: 05/14/2014 °[employer] Interactive Patient Education © 2017 [employer] ° °

## 2016-10-09 NOTE — ED Provider Notes (Signed)
[facility]-EMERGENCY DEPT Provider Note   CSN: [id number] Arrival date & time: 10/09/16  0911     History   Chief Complaint Chief Complaint  Patient presents with  . Abdominal Pain    HPI Wesley Nunez is a 21 y.o. male.  Patient is a healthy 21 year old male who states that he had dinner last night including some food with hot sauce but felt fine and several hours later started to get severe intense pain in the upper abdomen which caused him to vomit. He states the pain has not resolved since it started and now is starting to feel some pain on his right side as well. Vomiting initially improves the pain but then it returns. He had a normal bowel movement this morning and denies any diarrhea. He's had no travel outside of the country, sick contacts and takes no medications regularly.   The history is provided by the patient.  Abdominal Pain   This is a new problem. The current episode started yesterday. The problem occurs constantly. The problem has been gradually worsening. The pain is associated with an unknown factor. The pain is located in the epigastric region and RUQ. The pain is at a severity of 8/10. The pain is severe. Associated symptoms include anorexia, nausea and vomiting. Pertinent negatives include fever, diarrhea, constipation, dysuria, frequency and hematuria. Nothing aggravates the symptoms. Relieved by: some improvement with vomiting. Past medical history comments: no hx.    History reviewed. No pertinent past medical history.  There are no active problems to display for this patient.   No past surgical history on file.     Home Medications    Prior to Admission medications   Not on File    Family History History reviewed. No pertinent family history.  Social History Social History  Substance Use Topics  . Smoking status: Not on file  . Smokeless tobacco: Not on file  . Alcohol use Not on file     Allergies   Patient has no known  allergies.   Review of Systems Review of Systems  Constitutional: Negative for fever.  Gastrointestinal: Positive for abdominal pain, anorexia, nausea and vomiting. Negative for constipation and diarrhea.  Genitourinary: Negative for dysuria, frequency and hematuria.  All other systems reviewed and are negative.    Physical Exam Updated Vital Signs BP (!) 151/98 (BP Location: Left Arm)   Pulse (!) 52   Resp 18   Ht  (1.676 m)   Wt 86.2 kg (190 lb)   SpO2 99%   BMI 30.67 kg/m   Physical Exam  Constitutional: He is oriented to person, place, and time. He appears well-developed and well-nourished. No distress.  HENT:  Head: Normocephalic and atraumatic.  Mouth/Throat: Oropharynx is clear and moist.  Eyes: Pupils are equal, round, and reactive to light. Conjunctivae and EOM are normal.  Neck: Normal range of motion. Neck supple.  Cardiovascular: Normal rate, regular rhythm and intact distal pulses.   No murmur heard. Pulmonary/Chest: Effort normal and breath sounds normal. No respiratory distress. He has no wheezes. He has no rales.  Abdominal: Soft. He exhibits no distension. There is tenderness in the periumbilical area. There is no rebound and no guarding.    Musculoskeletal: Normal range of motion. He exhibits no edema or tenderness.  Neurological: He is alert and oriented to person, place, and time.  Skin: Skin is warm and dry. No rash noted. No erythema.  Psychiatric: He has a normal mood and affect. His behavior is normal.  Nursing note and vitals reviewed.    ED Treatments / Results  Labs (all labs ordered are listed, but only abnormal results are displayed) Labs Reviewed  COMPREHENSIVE METABOLIC PANEL - Abnormal; Notable for the following:       Result Value   Glucose, Bld 132 (*)    Total Protein 8.8 (*)    Albumin 5.2 (*)    All other components within normal limits  CBC - Abnormal; Notable for the following:    WBC 17.2 (*)    All other components  within normal limits  URINALYSIS, ROUTINE W REFLEX MICROSCOPIC - Abnormal; Notable for the following:    APPearance CLOUDY (*)    All other components within normal limits  LIPASE, BLOOD    EKG  EKG Interpretation None       Radiology Ct Abdomen Pelvis W Contrast  Result Date: 10/09/2016 CLINICAL DATA:  Upper abdominal pain beginning last evening. Nausea and vomiting. EXAM: CT ABDOMEN AND PELVIS WITH CONTRAST TECHNIQUE: Multidetector CT imaging of the abdomen and pelvis was performed using the standard protocol following bolus administration of intravenous contrast. CONTRAST:  <See Chart> ISOVUE-300 IOPAMIDOL (ISOVUE-300) INJECTION 61% COMPARISON:  None. FINDINGS: Lower chest: Limited visualization of the lower thorax demonstrates minimal dependent subpleural ground-glass atelectasis. No discrete focal airspace opacities. No pleural effusion. Normal heart size. No pericardial effusion. Hepatobiliary: Normal hepatic contour. There is a minimal amount of focal fatty infiltration adjacent to the fissure for ligamentum teres. No discrete hepatic lesions. Normal appearance of the gallbladder given degree distention. No radiopaque gallstones. No intra extrahepatic bili duct dilatation. No ascites. Pancreas: Normal appearance of the pancreas Spleen: Normal appearance of the spleen Adrenals/Urinary Tract: There is symmetric enhancement of the bilateral kidneys. No definite renal stones on this postcontrast examination. No discrete renal lesions. No urine obstruction or perinephric stranding. Normal appearance the bilateral adrenal glands. Normal appearance of the urinary bladder given degree distention. Stomach/Bowel: The appendix is dilated, measuring 1.2 cm in diameter (coronal image 82) with associated minimal amount of periappendiceal stranding (Representative coronal images 82 and 86, series 3, axial image 41, series 2). Feculent material is seen within the base of the appendix. No definitive  radiopaque appendicolith. The bowel is otherwise normal in course and caliber without wall thickening or evidence of enteric obstruction. Normal appearance of the terminal ileum. No pneumoperitoneum, pneumatosis or portal venous gas. Vascular/Lymphatic: Normal caliber of the abdominal aorta. The major branch vessels of the abdominal aorta appear patent on this non CTA examination. No bulky retroperitoneal, mesenteric, pelvic or inguinal lymphadenopathy. Reproductive: Normal appearance of the prostate gland. No free fluid in the pelvic cul-de-sac. Other: Regional soft tissues appear normal. Musculoskeletal: No acute or aggressive osseous abnormalities. IMPRESSION: Findings worrisome for acute uncomplicated appendicitis. No evidence of perforation or definable / drainable fluid collection. Electronically Signed   By: Simonne Come M.D.   On: 10/09/2016 14:34    Procedures Procedures (including critical care time)  Medications Ordered in ED Medications  sodium chloride 0.9 % bolus 1,000 mL (1,000 mLs Intravenous New Bag/Given 10/09/16 1110)  ondansetron (ZOFRAN-ODT) disintegrating tablet 4 mg (4 mg Oral Given 10/09/16 1025)  morphine 4 MG/ML injection 4 mg (4 mg Intravenous Given 10/09/16 1109)     Initial Impression / Assessment and Plan / ED Course  I have reviewed the triage vital signs and the nursing notes.  Pertinent labs & imaging results that were available during my care of the patient were reviewed by me and considered in my  medical decision making (see chart for details).     Young male presenting with abdominal pain and vomiting. This could be gastritis versus gallbladder pathology versus hepatitis versus appendicitis. Low suspicion for diverticulitis. Possibility for renal colic. CBC, CMP, lipase, UA pending. Patient given IV fluids, pain and nausea control.  11:47 AM Labs are normal except for leukocytosis of 17,000. With the nature of the patient's pain in the movement of pain towards  the right lower quadrant concerning for potential appendicitis. CT pending  2:44 PM CT concerning for appy.  Pt started on rocephin and flagyl.  Pt is NPO since 9pm last night.  Pain control given and will consult gen surgery  Final Clinical Impressions(s) / ED Diagnoses   Final diagnoses:  Acute appendicitis with localized peritonitis    New Prescriptions New Prescriptions   No medications on file     Gwyneth Sprout, MD 10/09/16 1445

## 2016-10-09 NOTE — Op Note (Signed)
PATIENT:  Wesley Nunez  21 y.o. male  Patient Care Team: Patient, No Pcp Per as PCP - General (General Practice) Karie Soda, MD as Consulting Physician (General Surgery)  PRE-OPERATIVE DIAGNOSIS:  Acute appendicitis  POST-OPERATIVE DIAGNOSIS:  Acute appendicitis  PROCEDURE:  DIAGNOSTIC LAPAROSCOPY WITH APPENDECTOMY  SURGEON:  Ardeth Sportsman, MD  ASSIST: Shea Evans, PA-S, [employer]   ANESTHESIA:   local and general  EBL:  Total I/O In: -  Out: 515 [Urine:500; Blood:15]  Delay start of Pharmacological VTE agent (>24hrs) due to surgical blood loss or risk of bleeding:  no  DRAINS: none   SPECIMEN:  APPENDIX  DISPOSITION OF SPECIMEN:  PATHOLOGY  COUNTS:  YES  PLAN OF CARE: Admit for overnight observation  PATIENT DISPOSITION:  PACU - hemodynamically stable.   INDICATIONS: Patient with concerning symptoms & work up suspicious for appendicitis.  Surgery was recommended:  The anatomy & physiology of the digestive tract was discussed.  The pathophysiology of appendicitis was discussed.  Natural history risks without surgery was discussed.   I feel the risks of no intervention will lead to serious problems that outweigh the operative risks; therefore, I recommended diagnostic laparoscopy with removal of appendix to remove the pathology.  Laparoscopic & open techniques were discussed.   I noted a good likelihood this will help address the problem.    Risks such as bleeding, infection, abscess, leak, reoperation, possible ostomy, hernia, heart attack, death, and other risks were discussed.  Goals of post-operative recovery were discussed as well.  We will work to minimize complications.  Questions were answered.  The patient expresses understanding & wishes to proceed with surgery.  OR FINDINGS:  Retrocecal appendix with tip near liver.  Moderate ischemia but no definite necrosis.  Some localized peritonitis but no abscess  DESCRIPTION:   The patient was  identified & brought into the operating room. The patient was positioned supine with arms tucked. SCDs were active during the entire case. The patient underwent general anesthesia without any difficulty.  The abdomen was prepped and draped in a sterile fashion. A Surgical Timeout confirmed our plan.  I made a transverse incision through the superior umbilical fold.  I made a small transverse nick through the infraumbilical fascia and confirmed peritoneal entry.  I placed a 5mm port.  We induced carbon dioxide insufflation.  Camera inspection revealed no injury.  I placed additional ports under direct laparoscopic visualization.  I was able to identify the appendix running retrocolically with the the tip near the liver.  I mobilized the ascending colon in a lateral to medial fashion.  I took care to avoid injuring any retroperitoneal structures.   I freed the appendix off its attachments to the ascending colon and cecal mesentery.  I elevated the appendix. I skeletonized the mesoappendix. I was able to free off the base of the appendix which was still viable.  I stapled the appendix off the cecum using a laparoscopic stapler. I took a healthy cuff of viable cecum. I ligated the mesoappendix and assured hemostasis in the mesentery.  I placed the appendix inside an EcoSac bag and removed out the 12 mm port.  I did copious irrigation. Hemostasis was good in the mesoappendix, colon mesentery, and retroperitoneum. Staple line was intact on the cecum with no bleeding. I washed out the pelvis, retrohepatic space and right paracolic gutter. I washed out the left side as well.  Hemostasis is good. There was no perforation or injury. Because the area cleaned up  well after irrigation, I did not place a drain.   I aspirated the carbon dioxide.  I removed the ports.   I closed the LLQ suprapubic 12 mm stapler port site with a 0 Vicryl stitch.  I closed skin using 4-0 monocryl stitch.  Sterile dressings applied.  Patient  was extubated and sent to the recovery room. I suspect the patient is going used in the hospital at least overnight and will need antibiotics overnight. There is no family available, but I will look to see if any was here to discuss findings in postoperative goals.     Ardeth Sportsman, M.D., F.A.C.S. Gastrointestinal and Minimally Invasive Surgery [facility] [address], [address] [place], [place] [address] [phone] Main / Paging  10/09/2016 5:32 PM

## 2016-10-09 NOTE — Anesthesia Preprocedure Evaluation (Addendum)
Anesthesia Evaluation  Patient identified by MRN, date of birth, ID band Patient awake    Reviewed: Allergy & Precautions, NPO status , Patient's Chart, lab work & pertinent test results  Airway Mallampati: II  TM Distance: >3 FB Neck ROM: Full    Dental no notable dental hx. (+) Dental Advisory Given   Pulmonary neg pulmonary ROS, Current Smoker,    Pulmonary exam normal breath sounds clear to auscultation       Cardiovascular negative cardio ROS Normal cardiovascular exam Rhythm:Regular Rate:Normal     Neuro/Psych negative neurological ROS  negative psych ROS   GI/Hepatic negative GI ROS, Neg liver ROS,   Endo/Other  negative endocrine ROS  Renal/GU negative Renal ROS  negative genitourinary   Musculoskeletal negative musculoskeletal ROS (+)   Abdominal   Peds negative pediatric ROS (+)  Hematology negative hematology ROS (+)   Anesthesia Other Findings Probable appendicitis  Reproductive/Obstetrics negative OB ROS                            Anesthesia Physical Anesthesia Plan  ASA: II and emergent  Anesthesia Plan: General   Post-op Pain Management:    Induction: Intravenous, Rapid sequence and Cricoid pressure planned  PONV Risk Score and Plan: 1 and Ondansetron  Airway Management Planned: Oral ETT  Additional Equipment:   Intra-op Plan:   Post-operative Plan: Extubation in OR  Informed Consent: I have reviewed the patients History and Physical, chart, labs and discussed the procedure including the risks, benefits and alternatives for the proposed anesthesia with the patient or authorized representative who has indicated his/her understanding and acceptance.   Dental advisory given  Plan Discussed with: CRNA  Anesthesia Plan Comments:         Anesthesia Quick Evaluation

## 2016-10-10 ENCOUNTER — Encounter (HOSPITAL_COMMUNITY): Payer: Self-pay | Admitting: Surgery

## 2016-10-10 LAB — HIV ANTIBODY (ROUTINE TESTING W REFLEX): HIV SCREEN 4TH GENERATION: NONREACTIVE

## 2016-10-10 NOTE — Discharge Summary (Signed)
Physician Discharge Summary  Patient ID: Wesley Nunez MRN: [id number] DOB/AGE: 1995/03/12  21 y.o.  Admit date: 10/09/2016 Discharge date: 10/10/2016  Patient Care Team: Patient, No Pcp Per as PCP - General (General Practice) Michael Boston, MD as Consulting Physician (General Surgery)  Discharge Diagnoses:  Principal Problem:   Acute appendicitis s/p lap appendectomy 10/09/2016 Active Problems:   Tobacco abuse   1 Day Post-Op  10/09/2016  POST-OPERATIVE DIAGNOSIS:   appendicitis  SURGERY:  10/09/2016  Procedure(s): APPENDECTOMY LAPAROSCOPIC  SURGEON:    Surgeon(s): Michael Boston, MD  Consults: None  Hospital Course:   The patient underwent the surgery above.  Postoperatively, the patient gradually mobilized and advanced to a solid diet.  Pain and other symptoms were treated aggressively.    By the time of discharge, the patient was walking well the hallways, eating food, having flatus.  Pain was well-controlled on an oral medications.  Based on meeting discharge criteria and continuing to recover, I felt it was safe for the patient to be discharged from the hospital to further recover with close followup. Postoperative recommendations were discussed in detail.  They are written as well.  Patient lives in [place] and was hoping to drive back with his family tonight.  We cautioned against this as he just completed emergent surgery.  We advised that he wait a few days before doing so, and he will consider this.  Patient performs manual labor at work.  Recommended he return to work on Monday 10/15 with restrictions (light duty, < 30 lbs lifting).  Advise he follow up w/ general surgery in [place] w/in 3 weeks to monitor.  Discharged Condition: good  Disposition:  Follow-up Information    [facility]len[facility] Hurl, [place]. Schedule an appointment as soon as possible for a visit in 3 week(s).   Specialty:  General Surgery Why:  To follow up after your operation, To follow up after your hospital  stay Contact information: [address] [place] [address] [phone]           Final discharge disposition not confirmed  Discharge Instructions    Call MD for:    Complete by:  As directed    FEVER > 101.5 F  (temperatures < 101.5 F are not significant)   Call MD for:  extreme fatigue    Complete by:  As directed    Call MD for:  persistant dizziness or light-headedness    Complete by:  As directed    Call MD for:  persistant nausea and vomiting    Complete by:  As directed    Call MD for:  redness, tenderness, or signs of infection (pain, swelling, redness, odor or green/yellow discharge around incision site)    Complete by:  As directed    Call MD for:  severe uncontrolled pain    Complete by:  As directed    Diet - low sodium heart healthy    Complete by:  As directed    Follow a light diet the first few days at home.   Start with a bland diet such as soups, liquids, starchy foods, low fat foods, etc.   If you feel full, bloated, or constipated, stay on a full liquid or pureed/blenderized diet for a few days until you feel better and no longer constipated. Be sure to drink plenty of fluids every day to avoid getting dehydrated (feeling dizzy, not urinating, etc.). Gradually add a fiber supplement to your diet   Discharge instructions  Complete by:  As directed    See Discharge Instructions If you are not getting better after two weeks or are noticing you are getting worse, contact our office [phone] for further advice.  We may need to adjust your medications, re-evaluate you in the office, send you to the emergency room, or see what other things we can do to help. The clinic staff is available to answer your questions during regular business hours (8:30am-5pm).  Please don't hesitate to call and ask to speak to one of our nurses for clinical concerns.    A surgeon from [facility] is always on call at the hospitals 24 hours/day If  you have a medical emergency, go to the nearest emergency room or call 911.   Driving Restrictions    Complete by:  As directed    You may drive when you are no longer taking narcotic prescription pain medication, you can comfortably wear a seatbelt, and you can safely make sudden turns/stops to protect yourself without hesitating due to pain.   Increase activity slowly    Complete by:  As directed    Start light daily activities --- self-care, walking, climbing stairs- beginning the day after surgery.  Gradually increase activities as tolerated.  Control your pain to be active.  Stop when you are tired.  Ideally, walk several times a day, eventually an hour a day.   Most people are back to most day-to-day activities in a few weeks.  It takes 4-8 weeks to get back to unrestricted, intense activity. If you can walk 30 minutes without difficulty, it is safe to try more intense activity such as jogging, treadmill, bicycling, low-impact aerobics, swimming, etc. Save the most intensive and strenuous activity for last (Usually 4-8 weeks after surgery) such as sit-ups, heavy lifting, contact sports, etc.  Refrain from any intense heavy lifting or straining until you are off narcotics for pain control.  You will have off days, but things should improve week-by-week. DO NOT PUSH THROUGH PAIN.  Let pain be your guide: If it hurts to do something, don't do it.  Pain is your body warning you to avoid that activity for another week until the pain goes down.   Lifting restrictions    Complete by:  As directed    If you can walk 30 minutes without difficulty, it is safe to try more intense activity such as jogging, treadmill, bicycling, low-impact aerobics, swimming, etc. Save the most intensive and strenuous activity for last (Usually 4-8 weeks after surgery) such as sit-ups, heavy lifting, contact sports, etc.  Refrain from any intense heavy lifting or straining until you are off narcotics for pain control.  You  will have off days, but things should improve week-by-week. DO NOT PUSH THROUGH PAIN.  Let pain be your guide: If it hurts to do something, don't do it.  Pain is your body warning you to avoid that activity for another week until the pain goes down.   May walk up steps    Complete by:  As directed    No wound care    Complete by:  As directed    It is good for closed incision and even open wounds to be washed every day.  Shower every day.  Short baths are fine.  Wash the incisions and wounds clean with soap & water.    If you have a closed incision(s), wash the incision with soap & water every day.  You may leave closed incisions  open to air if it is dry.   You may cover the incision with clean gauze & replace it after your daily shower for comfort. If you have skin tapes (Steristrips) or skin glue (Dermabond) on your incision, leave them in place.  They will fall off on their own like a scab.  You may trim any edges that curl up with clean scissors.  If you have staples, set up an appointment for them to be removed in the office in 10 days after surgery.  If you have a drain, wash around the skin exit site with soap & water and place a new dressing of gauze or band aid around the skin every day.  Keep the drain site clean & dry.   Sexual Activity Restrictions    Complete by:  As directed    You may have sexual intercourse when it is comfortable. If it hurts to do something, stop.      Allergies as of 10/10/2016   No Known Allergies     Medication List    TAKE these medications   naproxen 500 MG tablet Commonly known as:  NAPROSYN Take 1 tablet (500 mg total) by mouth every 12 (twelve) hours as needed for mild pain or moderate pain.   traMADol 50 MG tablet Commonly known as:  ULTRAM Take 1-2 tablets (50-100 mg total) by mouth every 6 (six) hours as needed for moderate pain or severe pain.            Discharge Care Instructions        Start     Ordered   10/09/16 0000  traMADol  (ULTRAM) 50 MG tablet  Every 6 hours PRN     10/09/16 1727   10/09/16 0000  naproxen (NAPROSYN) 500 MG tablet  Every 12 hours PRN     10/09/16 1727   10/09/16 0000  Discharge instructions    Comments:  See Discharge Instructions If you are not getting better after two weeks or are noticing you are getting worse, contact our office [phone] for further advice.  We may need to adjust your medications, re-evaluate you in the office, send you to the emergency room, or see what other things we can do to help. The clinic staff is available to answer your questions during regular business hours (8:30am-5pm).  Please don't hesitate to call and ask to speak to one of our nurses for clinical concerns.    A surgeon from [facility] is always on call at the hospitals 24 hours/day If you have a medical emergency, go to the nearest emergency room or call 911.   10/09/16 1727   10/09/16 0000  Diet - low sodium heart healthy    Comments:  Follow a light diet the first few days at home.   Start with a bland diet such as soups, liquids, starchy foods, low fat foods, etc.   If you feel full, bloated, or constipated, stay on a full liquid or pureed/blenderized diet for a few days until you feel better and no longer constipated. Be sure to drink plenty of fluids every day to avoid getting dehydrated (feeling dizzy, not urinating, etc.). Gradually add a fiber supplement to your diet   10/09/16 1727   10/09/16 0000  Increase activity slowly    Comments:  Start light daily activities --- self-care, walking, climbing stairs- beginning the day after surgery.  Gradually increase activities as tolerated.  Control your pain to be active.  Stop when you  are tired.  Ideally, walk several times a day, eventually an hour a day.   Most people are back to most day-to-day activities in a few weeks.  It takes 4-8 weeks to get back to unrestricted, intense activity. If you can walk 30 minutes without difficulty,  it is safe to try more intense activity such as jogging, treadmill, bicycling, low-impact aerobics, swimming, etc. Save the most intensive and strenuous activity for last (Usually 4-8 weeks after surgery) such as sit-ups, heavy lifting, contact sports, etc.  Refrain from any intense heavy lifting or straining until you are off narcotics for pain control.  You will have off days, but things should improve week-by-week. DO NOT PUSH THROUGH PAIN.  Let pain be your guide: If it hurts to do something, don't do it.  Pain is your body warning you to avoid that activity for another week until the pain goes down.   10/09/16 1727   10/09/16 0000  May walk up steps     10/09/16 1727   10/09/16 0000  Driving Restrictions    Comments:  You may drive when you are no longer taking narcotic prescription pain medication, you can comfortably wear a seatbelt, and you can safely make sudden turns/stops to protect yourself without hesitating due to pain.   10/09/16 1727   10/09/16 0000  Lifting restrictions    Comments:  If you can walk 30 minutes without difficulty, it is safe to try more intense activity such as jogging, treadmill, bicycling, low-impact aerobics, swimming, etc. Save the most intensive and strenuous activity for last (Usually 4-8 weeks after surgery) such as sit-ups, heavy lifting, contact sports, etc.  Refrain from any intense heavy lifting or straining until you are off narcotics for pain control.  You will have off days, but things should improve week-by-week. DO NOT PUSH THROUGH PAIN.  Let pain be your guide: If it hurts to do something, don't do it.  Pain is your body warning you to avoid that activity for another week until the pain goes down.   10/09/16 1727   10/09/16 0000  Sexual Activity Restrictions    Comments:  You may have sexual intercourse when it is comfortable. If it hurts to do something, stop.   10/09/16 1727   10/09/16 0000  No wound care    Comments:  It is good for closed  incision and even open wounds to be washed every day.  Shower every day.  Short baths are fine.  Wash the incisions and wounds clean with soap & water.    If you have a closed incision(s), wash the incision with soap & water every day.  You may leave closed incisions open to air if it is dry.   You may cover the incision with clean gauze & replace it after your daily shower for comfort. If you have skin tapes (Steristrips) or skin glue (Dermabond) on your incision, leave them in place.  They will fall off on their own like a scab.  You may trim any edges that curl up with clean scissors.  If you have staples, set up an appointment for them to be removed in the office in 10 days after surgery.  If you have a drain, wash around the skin exit site with soap & water and place a new dressing of gauze or band aid around the skin every day.  Keep the drain site clean & dry.   10/09/16 1727   10/09/16 0000  Call MD for:  Comments:  FEVER > 101.5 F  (temperatures < 101.5 F are not significant)   10/09/16 1727   10/09/16 0000  Call MD for:  persistant nausea and vomiting     10/09/16 1727   10/09/16 0000  Call MD for:  severe uncontrolled pain     10/09/16 1727   10/09/16 0000  Call MD for:  redness, tenderness, or signs of infection (pain, swelling, redness, odor or green/yellow discharge around incision site)     10/09/16 1727   10/09/16 0000  Call MD for:  persistant dizziness or light-headedness     10/09/16 1727   10/09/16 0000  Call MD for:  extreme fatigue     10/09/16 1727      Significant Diagnostic Studies:  Results for orders placed or performed during the hospital encounter of 10/09/16 (from the past 72 hour(s))  Lipase, blood     Status: None   Collection Time: 10/09/16 10:56 AM  Result Value Ref Range   Lipase 24 11 - 51 U/L  Comprehensive metabolic panel     Status: Abnormal   Collection Time: 10/09/16 10:56 AM  Result Value Ref Range   Sodium 138 135 - 145 mmol/L   Potassium  4.2 3.5 - 5.1 mmol/L   Chloride 101 101 - 111 mmol/L   CO2 28 22 - 32 mmol/L   Glucose, Bld 132 (H) 65 - 99 mg/dL   BUN 20 6 - 20 mg/dL   Creatinine, Ser 1.00 0.61 - 1.24 mg/dL   Calcium 10.0 8.9 - 10.3 mg/dL   Total Protein 8.8 (H) 6.5 - 8.1 g/dL   Albumin 5.2 (H) 3.5 - 5.0 g/dL   AST 27 15 - 41 U/L   ALT 45 17 - 63 U/L   Alkaline Phosphatase 55 38 - 126 U/L   Total Bilirubin 0.5 0.3 - 1.2 mg/dL   GFR calc non Af Amer >60 >60 mL/min   GFR calc Af Amer >60 >60 mL/min    Comment: (NOTE) The eGFR has been calculated using the CKD EPI equation. This calculation has not been validated in all clinical situations. eGFR's persistently <60 mL/min signify possible Chronic Kidney Disease.    Anion gap 9 5 - 15  CBC     Status: Abnormal   Collection Time: 10/09/16 10:56 AM  Result Value Ref Range   WBC 17.2 (H) 4.0 - 10.5 K/uL   RBC 4.94 4.22 - 5.81 MIL/uL   Hemoglobin 15.0 13.0 - 17.0 g/dL   HCT 42.4 39.0 - 52.0 %   MCV 85.8 78.0 - 100.0 fL   MCH 30.4 26.0 - 34.0 pg   MCHC 35.4 30.0 - 36.0 g/dL   RDW 12.6 11.5 - 15.5 %   Platelets 240 150 - 400 K/uL  Urinalysis, Routine w reflex microscopic     Status: Abnormal   Collection Time: 10/09/16 11:01 AM  Result Value Ref Range   Color, Urine YELLOW YELLOW   APPearance CLOUDY (A) CLEAR   Specific Gravity, Urine 1.021 1.005 - 1.030   pH 7.0 5.0 - 8.0   Glucose, UA NEGATIVE NEGATIVE mg/dL   Hgb urine dipstick NEGATIVE NEGATIVE   Bilirubin Urine NEGATIVE NEGATIVE   Ketones, ur NEGATIVE NEGATIVE mg/dL   Protein, ur NEGATIVE NEGATIVE mg/dL   Nitrite NEGATIVE NEGATIVE   Leukocytes, UA NEGATIVE NEGATIVE    Ct Abdomen Pelvis W Contrast  Result Date: 10/09/2016 CLINICAL DATA:  Upper abdominal pain beginning last evening. Nausea and vomiting. EXAM: CT ABDOMEN AND  PELVIS WITH CONTRAST TECHNIQUE: Multidetector CT imaging of the abdomen and pelvis was performed using the standard protocol following bolus administration of intravenous contrast.  CONTRAST:  <See Chart> ISOVUE-300 IOPAMIDOL (ISOVUE-300) INJECTION 61% COMPARISON:  None. FINDINGS: Lower chest: Limited visualization of the lower thorax demonstrates minimal dependent subpleural ground-glass atelectasis. No discrete focal airspace opacities. No pleural effusion. Normal heart size. No pericardial effusion. Hepatobiliary: Normal hepatic contour. There is a minimal amount of focal fatty infiltration adjacent to the fissure for ligamentum teres. No discrete hepatic lesions. Normal appearance of the gallbladder given degree distention. No radiopaque gallstones. No intra extrahepatic bili duct dilatation. No ascites. Pancreas: Normal appearance of the pancreas Spleen: Normal appearance of the spleen Adrenals/Urinary Tract: There is symmetric enhancement of the bilateral kidneys. No definite renal stones on this postcontrast examination. No discrete renal lesions. No urine obstruction or perinephric stranding. Normal appearance the bilateral adrenal glands. Normal appearance of the urinary bladder given degree distention. Stomach/Bowel: The appendix is dilated, measuring 1.2 cm in diameter (coronal image 82) with associated minimal amount of periappendiceal stranding (Representative coronal images 82 and 86, series 3, axial image 41, series 2). Feculent material is seen within the base of the appendix. No definitive radiopaque appendicolith. The bowel is otherwise normal in course and caliber without wall thickening or evidence of enteric obstruction. Normal appearance of the terminal ileum. No pneumoperitoneum, pneumatosis or portal venous gas. Vascular/Lymphatic: Normal caliber of the abdominal aorta. The major branch vessels of the abdominal aorta appear patent on this non CTA examination. No bulky retroperitoneal, mesenteric, pelvic or inguinal lymphadenopathy. Reproductive: Normal appearance of the prostate gland. No free fluid in the pelvic cul-de-sac. Other: Regional soft tissues appear normal.  Musculoskeletal: No acute or aggressive osseous abnormalities. IMPRESSION: Findings worrisome for acute uncomplicated appendicitis. No evidence of perforation or definable / drainable fluid collection. Electronically Signed   By: Sandi Mariscal M.D.   On: 10/09/2016 14:34    Discharge Exam: Blood pressure (!) 136/56, pulse 70, temperature 98.5 F (36.9 C), temperature source Oral, resp. rate 14, height 5' 6"  (1.676 m), weight 86.2 kg (190 lb), SpO2 98 %.  General: Pt awake/alert/oriented x4 in No acute distress Eyes: PERRL, normal EOM.  Sclera clear.  No icterus Neuro: CN II-XII intact w/o focal sensory/motor deficits. Lymph: No head/neck/groin lymphadenopathy Psych:  No delerium/psychosis/paranoia HENT: Normocephalic, Mucus membranes moist.  No thrush Neck: Supple, No tracheal deviation Chest:  No chest wall pain w good excursion CV:  Pulses intact.  Regular rhythm MS: Normal AROM mjr joints.  No obvious deformity Abdomen: Soft.  Nondistended.  Mildly tender at incisions only.  No evidence of peritonitis.  No incarcerated hernias. Ext:  SCDs BLE.  No mjr edema.  No cyanosis Skin: No petechiae / purpura  History reviewed. No pertinent past medical history.  Past Surgical History:  Procedure Laterality Date  . LAPAROSCOPIC APPENDECTOMY N/A 10/09/2016   Procedure: APPENDECTOMY LAPAROSCOPIC;  Surgeon: Michael Boston, MD;  Location: [facility];  Service: General;  Laterality: N/A;    Social History   Social History  . Marital status: Married    Spouse name: N/A  . Number of children: N/A  . Years of education: N/A   Occupational History  . Not on file.   Social History Main Topics  . Smoking status: Current Every Day Smoker    Packs/day: 0.50    Years: 8.00    Types: Cigarettes  . Smokeless tobacco: Never Used  . Alcohol use No  . Drug use:  No  . Sexual activity: Not on file   Other Topics Concern  . Not on file   Social History Narrative  . No narrative on file    History  reviewed. No pertinent family history.  Current Facility-Administered Medications  Medication Dose Route Frequency Provider Last Rate Last Dose  . acetaminophen (TYLENOL) suppository 650 mg  650 mg Rectal Q6H PRN Michael Boston, MD      . acetaminophen (TYLENOL) tablet 325-650 mg  325-650 mg Oral Q6H PRN Michael Boston, MD   650 mg at 10/10/16 0615  . alum & mag hydroxide-simeth (MAALOX/MYLANTA) 200-200-20 MG/5ML suspension 30 mL  30 mL Oral Q6H PRN Michael Boston, MD      . bisacodyl (DULCOLAX) suppository 10 mg  10 mg Rectal Q12H PRN Michael Boston, MD      . cefTRIAXone (ROCEPHIN) 2 g in dextrose 5 % 50 mL IVPB  2 g Intravenous Q24H Michael Boston, MD       And  . metroNIDAZOLE (FLAGYL) IVPB 500 mg  500 mg Intravenous Tor Netters, MD 100 mL/hr at 10/10/16 0753 500 mg at 10/10/16 0753  . diphenhydrAMINE (BENADRYL) 12.5 MG/5ML elixir 12.5 mg  12.5 mg Oral Q6H PRN Michael Boston, MD       Or  . diphenhydrAMINE (BENADRYL) injection 12.5 mg  12.5 mg Intravenous Q6H PRN Michael Boston, MD      . enoxaparin (LOVENOX) injection 40 mg  40 mg Subcutaneous Q24H Michael Boston, MD      . guaiFENesin-dextromethorphan (ROBITUSSIN DM) 100-10 MG/5ML syrup 10 mL  10 mL Oral Q4H PRN Michael Boston, MD      . hydrocortisone (ANUSOL-HC) 2.5 % rectal cream 1 application  1 application Topical QID PRN Michael Boston, MD      . hydrocortisone cream 1 % 1 application  1 application Topical TID PRN Michael Boston, MD      . HYDROmorphone (DILAUDID) injection 0.5-2 mg  0.5-2 mg Intravenous Q1H PRN Michael Boston, MD   1 mg at 10/09/16 2116  . lactated ringers bolus 1,000 mL  1,000 mL Intravenous Q8H PRN Michael Boston, MD      . lactated ringers bolus 1,000 mL  1,000 mL Intravenous Q8H PRN Michael Boston, MD      . lactated ringers infusion   Intravenous Continuous Michael Boston, MD 50 mL/hr at 10/09/16 1900    . lip balm (CARMEX) ointment 1 application  1 application Topical BID Michael Boston, MD   1 application at 67/61/95  2200  . magic mouthwash  15 mL Oral QID PRN Michael Boston, MD      . menthol-cetylpyridinium (CEPACOL) lozenge 3 mg  1 lozenge Oral PRN Michael Boston, MD      . methocarbamol (ROBAXIN) 1,000 mg in dextrose 5 % 50 mL IVPB  1,000 mg Intravenous Q6H PRN Michael Boston, MD      . methocarbamol (ROBAXIN) tablet 1,000 mg  1,000 mg Oral Q6H PRN Michael Boston, MD   1,000 mg at 10/10/16 0615  . metoCLOPramide (REGLAN) injection 5-10 mg  5-10 mg Intravenous Q6H PRN Michael Boston, MD      . metoprolol tartrate (LOPRESSOR) injection 5 mg  5 mg Intravenous Q6H PRN Michael Boston, MD      . naproxen (NAPROSYN) tablet 500 mg  500 mg Oral BID WC Michael Boston, MD   500 mg at 10/10/16 0801  . ondansetron (ZOFRAN) injection 4 mg  4 mg Intravenous Q6H PRN Michael Boston, MD  Or  . ondansetron (ZOFRAN) 8 mg in sodium chloride 0.9 % 50 mL IVPB  8 mg Intravenous Q6H PRN Michael Boston, MD      . ondansetron (ZOFRAN-ODT) disintegrating tablet 4 mg  4 mg Oral Q6H PRN Michael Boston, MD       Or  . ondansetron (ZOFRAN) injection 4 mg  4 mg Intravenous Q6H PRN Michael Boston, MD      . phenol (CHLORASEPTIC) mouth spray 1-2 spray  1-2 spray Mouth/Throat PRN Michael Boston, MD      . polyethylene glycol (MIRALAX / GLYCOLAX) packet 17 g  17 g Oral Q12H PRN Michael Boston, MD      . prochlorperazine (COMPAZINE) injection 5-10 mg  5-10 mg Intravenous Q4H PRN Michael Boston, MD      . simethicone (MYLICON) chewable tablet 40 mg  40 mg Oral Q6H PRN Michael Boston, MD      . traMADol Veatrice Bourbon) tablet 50-100 mg  50-100 mg Oral Q6H PRN Michael Boston, MD         No Known Allergies  Signed: Tanzania Hall-Potvin, PA-S    10/10/2016, 8:33 AM

## 2016-10-10 NOTE — Progress Notes (Signed)
Pt alert, oriented, ambulating, and tolerating diet. D/C instructions given along with prescription. All questions answered.

## 2016-10-10 NOTE — Progress Notes (Signed)
Consult:  Financial Assistance  LCSW spoke with CM who is reviewing chart and updated regarding patient needs and potiental medication needs. CM to follow up.  No other CSW needs noted at this time. If needs arise, please call.  Deretha Emory, MSW Clinical Social Work: Optician, dispensing Coverage for :  [phone]

## 2016-10-13 ENCOUNTER — Encounter (HOSPITAL_COMMUNITY): Payer: Self-pay | Admitting: Surgery

## 2018-03-02 IMAGING — CT CT ABD-PELV W/ CM
2 of 4 series · 15 of 46 positions shown, 17 images · IV contrast (ISOVUE)
Comparison: None.

CLINICAL DATA: Upper abdominal pain beginning last evening. Nausea
and vomiting.

EXAM:
CT ABDOMEN AND PELVIS WITH CONTRAST
TECHNIQUE: Multidetector CT imaging of the abdomen and pelvis was performed
using the standard protocol following bolus administration of
intravenous contrast.
CONTRAST:  <See Chart> [id number] IOPAMIDOL (TKVCM3-QAA) INJECTION
61%

[Series 2: abd/pel with · axial · 0.85mm/px · z∈[-530,-56]mm · 12 of 105 slices shown, 14 images]
[im 5/105  soft-tissue]
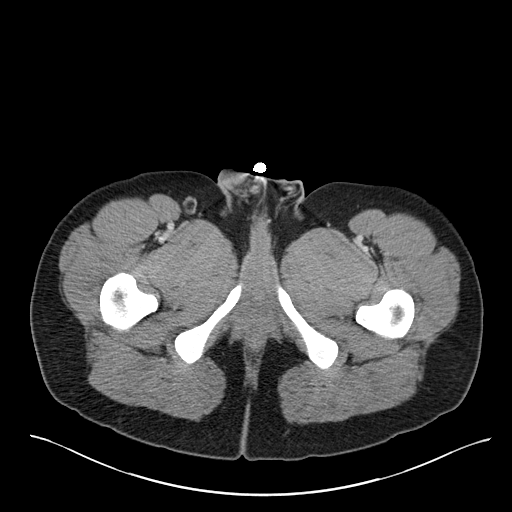
[im 5/105  bone]
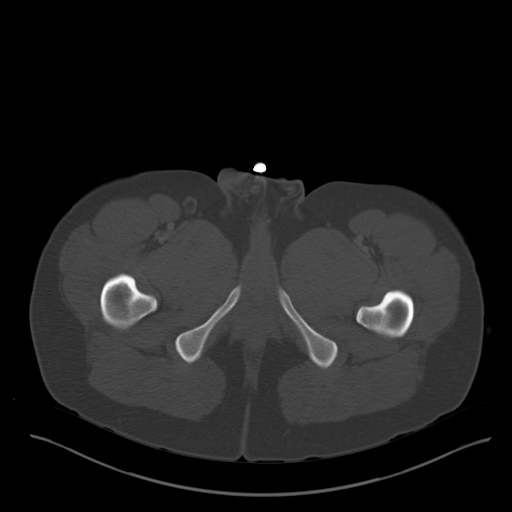
[im 15/105  soft-tissue]
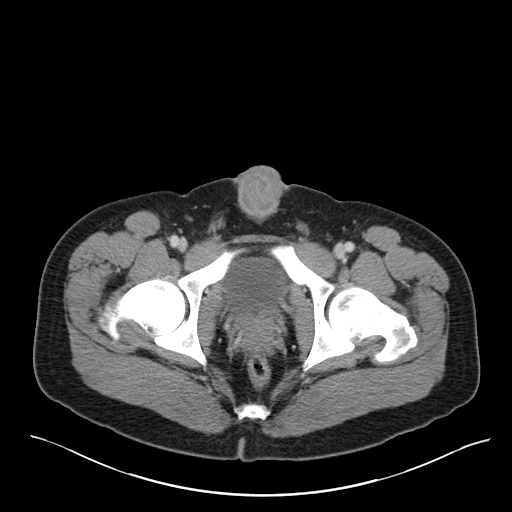
[im 25/105  soft-tissue]
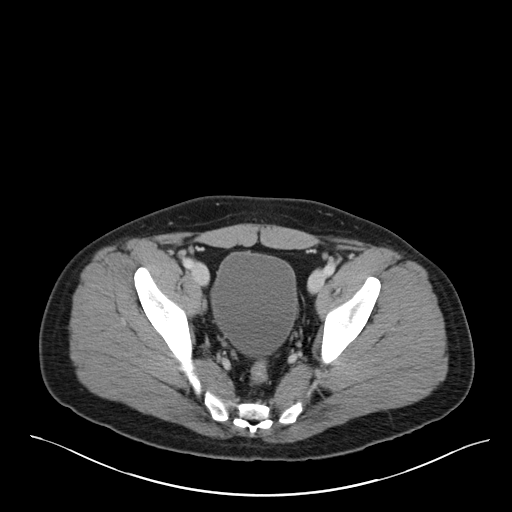
[im 30/105  soft-tissue]
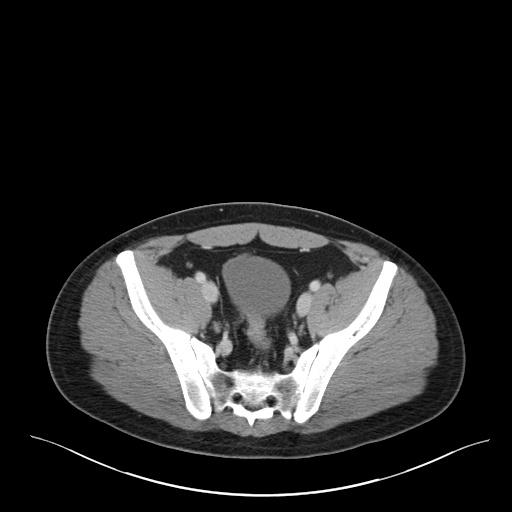
[im 40/105  soft-tissue]
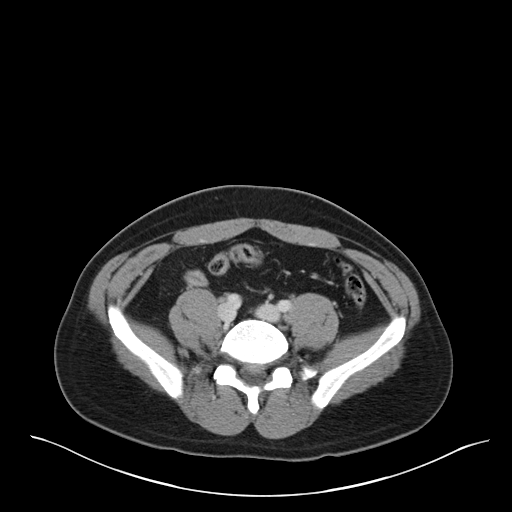
[im 50/105  soft-tissue]
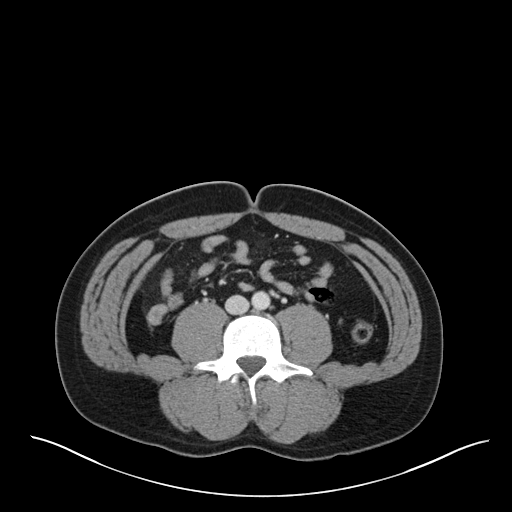
[im 55/105  soft-tissue]
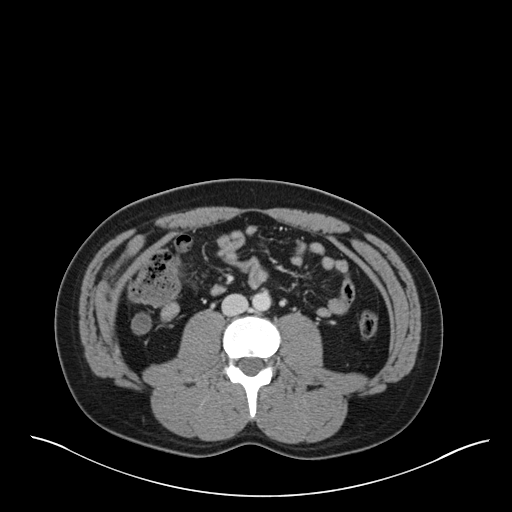
[im 65/105  soft-tissue]
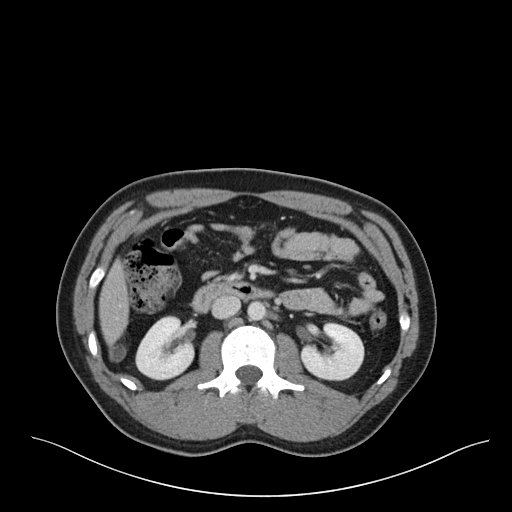
[im 75/105  soft-tissue]
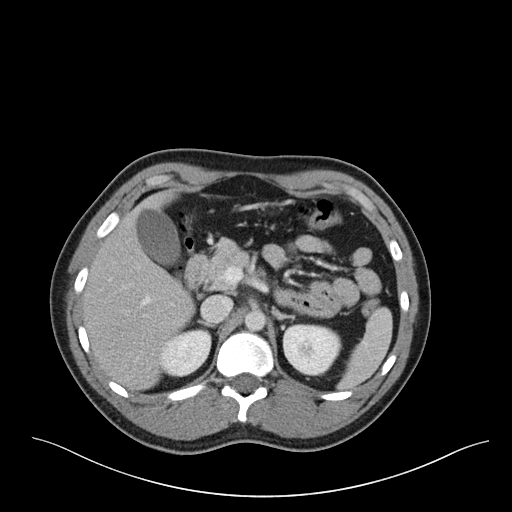
[im 75/105  bone]
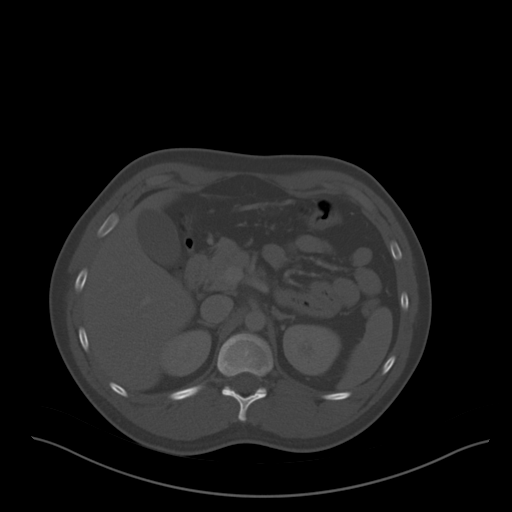
[im 80/105  soft-tissue]
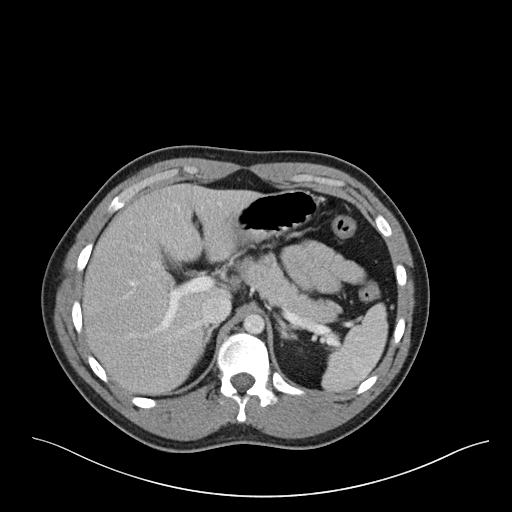
[im 90/105  soft-tissue]
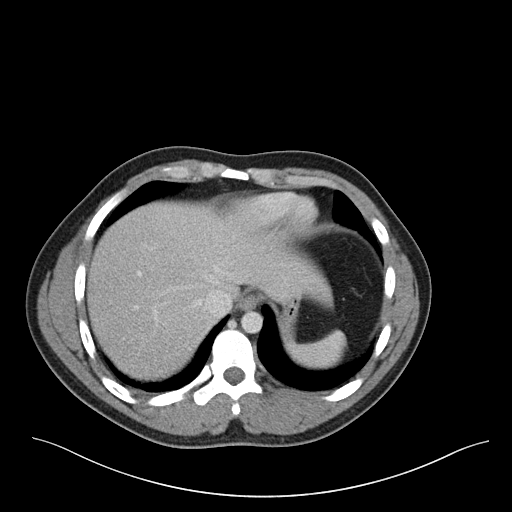
[im 100/105  soft-tissue]
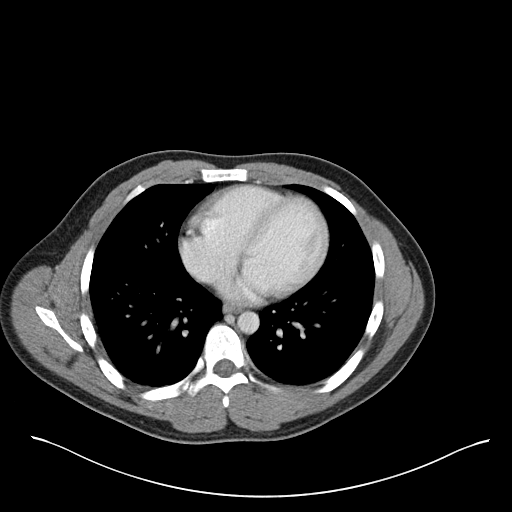

[Series 3: coronal a/|p · coronal · 0.74mm/px · 3 of 142 slices shown]
[im 48/142  soft-tissue]
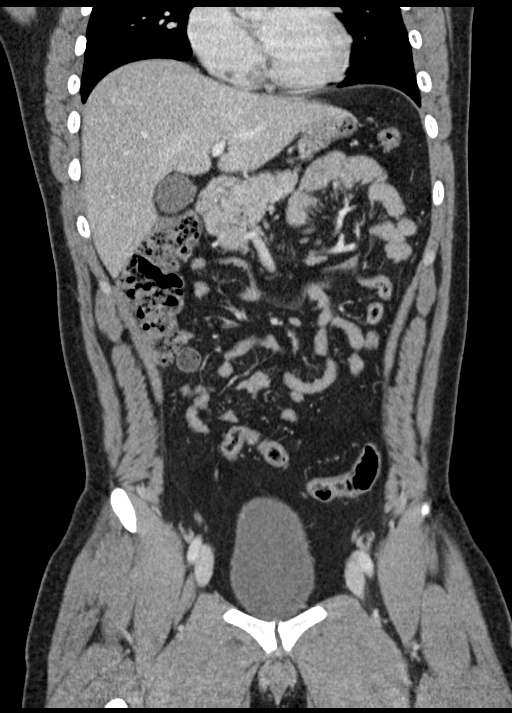
[im 63/142  soft-tissue]
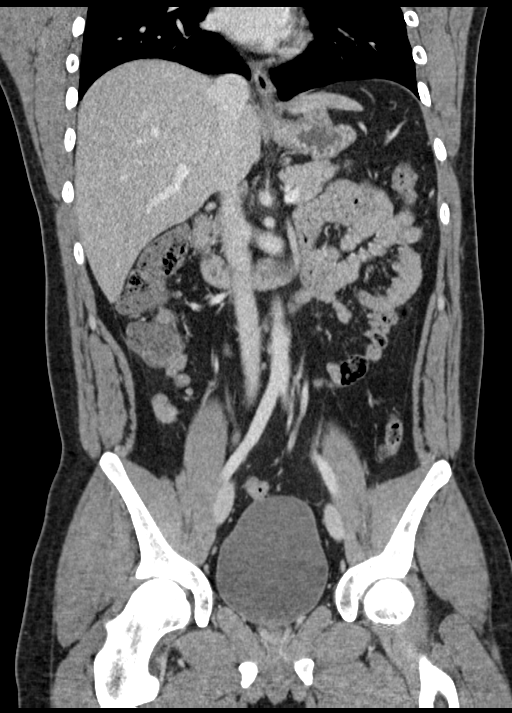
[im 79/142  soft-tissue]
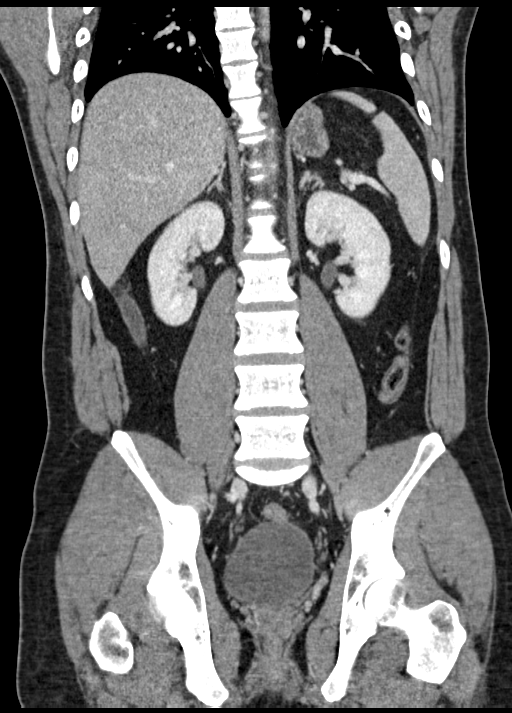

[15 of 46 positions shown; findings below may reference images not displayed]

FINDINGS: Lower chest: Limited visualization of the lower thorax demonstrates
minimal dependent subpleural ground-glass atelectasis. No discrete
focal airspace opacities. No pleural effusion. Normal heart size. No
pericardial effusion.

Hepatobiliary: Normal hepatic contour. There is a minimal amount of
focal fatty infiltration adjacent to the fissure for ligamentum
teres. No discrete hepatic lesions. Normal appearance of the
gallbladder given degree distention. No radiopaque gallstones. No
intra extrahepatic bili duct dilatation. No ascites.

Pancreas: Normal appearance of the pancreas

Spleen: Normal appearance of the spleen

Adrenals/Urinary Tract: There is symmetric enhancement of the
bilateral kidneys. No definite renal stones on this postcontrast
examination. No discrete renal lesions. No urine obstruction or
perinephric stranding.

Normal appearance the bilateral adrenal glands.

Normal appearance of the urinary bladder given degree distention.

Stomach/Bowel: The appendix is dilated, measuring 1.2 cm in diameter
(coronal image 82) with associated minimal amount of periappendiceal
stranding (Representative coronal images 82 and 86, series 3, axial
image 41, series 2). Feculent material is seen within the base of
the appendix. No definitive radiopaque appendicolith.

The bowel is otherwise normal in course and caliber without wall
thickening or evidence of enteric obstruction. Normal appearance of
the terminal ileum. No pneumoperitoneum, pneumatosis or portal
venous gas.

Vascular/Lymphatic: Normal caliber of the abdominal aorta. The major
branch vessels of the abdominal aorta appear patent on this non CTA
examination.

No bulky retroperitoneal, mesenteric, pelvic or inguinal
lymphadenopathy.

Reproductive: Normal appearance of the prostate gland. No free fluid
in the pelvic cul-de-sac.

Other: Regional soft tissues appear normal.

Musculoskeletal: No acute or aggressive osseous abnormalities.
IMPRESSION: Findings worrisome for acute uncomplicated appendicitis. No evidence
of perforation or definable / drainable fluid collection.
